# Patient Record
Sex: Male | Born: 1935 | Hispanic: No | Smoking: Never smoker
Health system: Southern US, Community
[De-identification: ages and names within clinical notes are randomized; demographics above are authoritative.]

## PROBLEM LIST (undated history)

## (undated) DIAGNOSIS — E119 Type 2 diabetes mellitus without complications: Secondary | ICD-10-CM

## (undated) DIAGNOSIS — I10 Essential (primary) hypertension: Secondary | ICD-10-CM

## (undated) DIAGNOSIS — I255 Ischemic cardiomyopathy: Secondary | ICD-10-CM

## (undated) DIAGNOSIS — E785 Hyperlipidemia, unspecified: Secondary | ICD-10-CM

## (undated) HISTORY — PX: CARDIAC DEFIBRILLATOR PLACEMENT: SHX171

---

## 2008-10-01 ENCOUNTER — Emergency Department (HOSPITAL_COMMUNITY): Admission: EM | Admit: 2008-10-01 | Discharge: 2008-10-01 | Payer: Self-pay | Admitting: Emergency Medicine

## 2011-01-07 LAB — POCT I-STAT, CHEM 8
BUN: 22 mg/dL (ref 6–23)
Chloride: 103 mEq/L (ref 96–112)
Creatinine, Ser: 1.4 mg/dL (ref 0.4–1.5)
Potassium: 3.6 mEq/L (ref 3.5–5.1)
Sodium: 141 mEq/L (ref 135–145)

## 2011-01-07 LAB — URINALYSIS, ROUTINE W REFLEX MICROSCOPIC
Glucose, UA: NEGATIVE mg/dL
Hgb urine dipstick: NEGATIVE
Specific Gravity, Urine: 1.015 (ref 1.005–1.030)
pH: 7 (ref 5.0–8.0)

## 2011-01-07 LAB — DIFFERENTIAL
Eosinophils Absolute: 0.2 10*3/uL (ref 0.0–0.7)
Lymphocytes Relative: 5 % — ABNORMAL LOW (ref 12–46)
Lymphs Abs: 0.9 10*3/uL (ref 0.7–4.0)
Monocytes Relative: 6 % (ref 3–12)
Neutrophils Relative %: 88 % — ABNORMAL HIGH (ref 43–77)

## 2011-01-07 LAB — CBC
MCV: 89.6 fL (ref 78.0–100.0)
RBC: 4.69 MIL/uL (ref 4.22–5.81)
WBC: 17.1 10*3/uL — ABNORMAL HIGH (ref 4.0–10.5)

## 2011-01-07 LAB — POCT CARDIAC MARKERS
CKMB, poc: 1.2 ng/mL (ref 1.0–8.0)
Myoglobin, poc: 90.2 ng/mL (ref 12–200)

## 2016-08-14 ENCOUNTER — Observation Stay (HOSPITAL_COMMUNITY)
Admission: EM | Admit: 2016-08-14 | Discharge: 2016-08-16 | Disposition: A | Discharge status: Home / Self Care | Payer: Medicare HMO | Attending: Internal Medicine | Admitting: Internal Medicine

## 2016-08-14 ENCOUNTER — Encounter (HOSPITAL_COMMUNITY): Payer: Self-pay

## 2016-08-14 DIAGNOSIS — Z794 Long term (current) use of insulin: Secondary | ICD-10-CM | POA: Diagnosis not present

## 2016-08-14 DIAGNOSIS — E11649 Type 2 diabetes mellitus with hypoglycemia without coma: Secondary | ICD-10-CM | POA: Diagnosis not present

## 2016-08-14 DIAGNOSIS — N189 Chronic kidney disease, unspecified: Secondary | ICD-10-CM

## 2016-08-14 DIAGNOSIS — Z7982 Long term (current) use of aspirin: Secondary | ICD-10-CM | POA: Insufficient documentation

## 2016-08-14 DIAGNOSIS — I10 Essential (primary) hypertension: Secondary | ICD-10-CM | POA: Diagnosis present

## 2016-08-14 DIAGNOSIS — N184 Chronic kidney disease, stage 4 (severe): Secondary | ICD-10-CM | POA: Insufficient documentation

## 2016-08-14 DIAGNOSIS — E119 Type 2 diabetes mellitus without complications: Secondary | ICD-10-CM

## 2016-08-14 DIAGNOSIS — Z95810 Presence of automatic (implantable) cardiac defibrillator: Secondary | ICD-10-CM | POA: Insufficient documentation

## 2016-08-14 DIAGNOSIS — E1151 Type 2 diabetes mellitus with diabetic peripheral angiopathy without gangrene: Secondary | ICD-10-CM | POA: Insufficient documentation

## 2016-08-14 DIAGNOSIS — E785 Hyperlipidemia, unspecified: Secondary | ICD-10-CM | POA: Insufficient documentation

## 2016-08-14 DIAGNOSIS — D631 Anemia in chronic kidney disease: Secondary | ICD-10-CM | POA: Diagnosis not present

## 2016-08-14 DIAGNOSIS — E1122 Type 2 diabetes mellitus with diabetic chronic kidney disease: Secondary | ICD-10-CM | POA: Diagnosis not present

## 2016-08-14 DIAGNOSIS — I129 Hypertensive chronic kidney disease with stage 1 through stage 4 chronic kidney disease, or unspecified chronic kidney disease: Secondary | ICD-10-CM | POA: Diagnosis not present

## 2016-08-14 DIAGNOSIS — G459 Transient cerebral ischemic attack, unspecified: Secondary | ICD-10-CM | POA: Diagnosis present

## 2016-08-14 DIAGNOSIS — M542 Cervicalgia: Secondary | ICD-10-CM | POA: Diagnosis not present

## 2016-08-14 DIAGNOSIS — F329 Major depressive disorder, single episode, unspecified: Secondary | ICD-10-CM | POA: Diagnosis not present

## 2016-08-14 DIAGNOSIS — I255 Ischemic cardiomyopathy: Secondary | ICD-10-CM | POA: Diagnosis not present

## 2016-08-14 DIAGNOSIS — Z9114 Patient's other noncompliance with medication regimen: Secondary | ICD-10-CM | POA: Insufficient documentation

## 2016-08-14 DIAGNOSIS — K219 Gastro-esophageal reflux disease without esophagitis: Secondary | ICD-10-CM | POA: Insufficient documentation

## 2016-08-14 DIAGNOSIS — K5900 Constipation, unspecified: Secondary | ICD-10-CM | POA: Diagnosis not present

## 2016-08-14 DIAGNOSIS — R1084 Generalized abdominal pain: Secondary | ICD-10-CM | POA: Insufficient documentation

## 2016-08-14 DIAGNOSIS — I700 Atherosclerosis of aorta: Secondary | ICD-10-CM | POA: Diagnosis not present

## 2016-08-14 DIAGNOSIS — Z862 Personal history of diseases of the blood and blood-forming organs and certain disorders involving the immune mechanism: Secondary | ICD-10-CM

## 2016-08-14 DIAGNOSIS — I313 Pericardial effusion (noninflammatory): Secondary | ICD-10-CM | POA: Insufficient documentation

## 2016-08-14 HISTORY — DX: Hyperlipidemia, unspecified: E78.5

## 2016-08-14 HISTORY — DX: Essential (primary) hypertension: I10

## 2016-08-14 HISTORY — DX: Type 2 diabetes mellitus without complications: E11.9

## 2016-08-14 HISTORY — DX: Ischemic cardiomyopathy: I25.5

## 2016-08-14 MED ORDER — SODIUM CHLORIDE 0.9 % IV BOLUS (SEPSIS)
1000.0000 mL | Freq: Once | INTRAVENOUS | Status: AC
  Administered 2016-08-15: 1000 mL via INTRAVENOUS

## 2016-08-14 NOTE — ED Triage Notes (Signed)
Patient comes by EMS for fatigue and near syncopal episode.  Patient is from out of town had 2 hr car ride here.  No pain and negative code stroke screen per ONI MD. A&Ox4

## 2016-08-14 NOTE — ED Provider Notes (Signed)
[facility]-EMERGENCY DEPT Provider Note   CSN: [id number] Arrival date & time: 08/14/16  2333  By signing my name below, I, Richard Baxter, attest that this documentation has been prepared under the direction and in the presence of Richard Crumble, MD . Electronically Signed: Freida Baxter, Scribe. 08/14/2016. 11:44 PM.  History   Chief Complaint Chief Complaint  Patient presents with  . Weakness   The history is provided by the patient and the EMS personnel. No language interpreter was used.     HPI Comments:  Richard Baxter is a 82 y.o. male with a history of DM,  who presents to the Emergency Department via EMS, complaining of LLE weakness which began ~ 2100 this evening. Pt states he was walking when the LLE "gave way". He reports associated moderate pain to the LLE. EMS reports 1 episode of nausea en route and state pt's daughter reported to them that the pt had slurred but EMS could not appreciate any slurred speech en route. Pt was given zofran with relief of the nausea. Pt denies h/o MI and CVA. Pt also notes he may have accidentally taken an extra dose of his Renexa today but is unsure. EMS reported blood sugar of 250 just PTA.    Past Medical History:  Diagnosis Date  . Hypertension     There are no active problems to display for this patient.   History reviewed. No pertinent surgical history.     Home Medications    Prior to Admission medications   Medication Sig Start Date End Date Taking? Authorizing Provider  amlodipine-atorvastatin (CADUET) 5-80 MG tablet Take 1 tablet by mouth daily.   Yes Historical Provider, MD  carvedilol (COREG) 6.25 MG tablet Take 6.25 mg by mouth 2 (two) times daily with a meal.   Yes Historical Provider, MD  citalopram (CELEXA) 10 MG tablet Take 10 mg by mouth daily.   Yes Historical Provider, MD  diphenhydramine-acetaminophen (TYLENOL PM) 25-500 MG TABS tablet Take 1-2 tablets by mouth at bedtime as needed (sleep).   Yes Historical Provider, MD    furosemide (LASIX) 40 MG tablet Take 40 mg by mouth daily.   Yes Historical Provider, MD  insulin detemir (LEVEMIR) 100 unit/ml SOLN Inject 65 Units into the skin every morning.   Yes Historical Provider, MD  isosorbide mononitrate (IMDUR) 30 MG 24 hr tablet Take 30 mg by mouth daily.   Yes Historical Provider, MD  omeprazole (PRILOSEC) 20 MG capsule Take 20 mg by mouth 2 (two) times daily.   Yes Historical Provider, MD  pregabalin (LYRICA) 75 MG capsule Take 75 mg by mouth 2 (two) times daily.   Yes Historical Provider, MD  ranolazine (RANEXA) 1000 MG SR tablet Take 1,000 mg by mouth 2 (two) times daily.   Yes Historical Provider, MD  traZODone (DESYREL) 50 MG tablet Take 50 mg by mouth at bedtime as needed for sleep.   Yes Historical Provider, MD    Family History History reviewed. No pertinent family history.  Social History Social History  Substance Use Topics  . Smoking status: Never Smoker  . Smokeless tobacco: Never Used  . Alcohol use No     Allergies   Patient has no known allergies.   Review of Systems Review of Systems  10 systems reviewed and all are negative for acute change except as noted in the HPI.   Physical Exam Updated Vital Signs BP 168/69   Pulse 63   Resp 15   SpO2 95%   Physical  Exam  Constitutional: He is oriented to person, place, and time. Vital signs are normal. He appears well-developed and well-nourished.  Non-toxic appearance. He does not appear ill. No distress.  HENT:  Head: Normocephalic and atraumatic.  Nose: Nose normal.  Mouth/Throat: Oropharynx is clear and moist. No oropharyngeal exudate.  Eyes: Conjunctivae and EOM are normal. Pupils are equal, round, and reactive to light. No scleral icterus.  Neck: Normal range of motion. Neck supple. No tracheal deviation, no edema, no erythema and normal range of motion present. No thyroid mass and no thyromegaly present.  Cardiovascular: Normal rate, regular rhythm, S1 normal, S2 normal,  normal heart sounds, intact distal pulses and normal pulses.  Exam reveals no gallop and no friction rub.   No murmur heard. Pulmonary/Chest: Effort normal and breath sounds normal. No respiratory distress. He has no wheezes. He has no rhonchi. He has no rales.  Pacemaker left chest wall   Abdominal: Soft. Normal appearance and bowel sounds are normal. He exhibits no distension, no ascites and no mass. There is no hepatosplenomegaly. There is no tenderness. There is no rebound, no guarding and no CVA tenderness.  Musculoskeletal: Normal range of motion. He exhibits edema. He exhibits no tenderness.  BLE edema   Lymphadenopathy:    He has no cervical adenopathy.  Neurological: He is alert and oriented to person, place, and time. He has normal strength. No cranial nerve deficit or sensory deficit.  Normal strength and sensation to all extremities Normal cerebellar testing   Skin: Skin is warm, dry and intact. No petechiae and no rash noted. He is not diaphoretic. No erythema. No pallor.  Nursing note and vitals reviewed.    ED Treatments / Results  DIAGNOSTIC STUDIES:  Oxygen Saturation is 100% on room air, normal by my interpretation.    COORDINATION OF CARE:  11:35 PM Discussed treatment plan with pt at bedside and pt agreed to plan.  Labs (all labs ordered are listed, but only abnormal results are displayed) Labs Reviewed  CBC - Abnormal; Notable for the following:       Result Value   RBC 3.98 (*)    Hemoglobin 12.0 (*)    HCT 35.7 (*)    All other components within normal limits  DIFFERENTIAL - Abnormal; Notable for the following:    Neutro Abs 7.8 (*)    All other components within normal limits  COMPREHENSIVE METABOLIC PANEL - Abnormal; Notable for the following:    Sodium 133 (*)    Chloride 100 (*)    Glucose, Bld 317 (*)    BUN 34 (*)    Creatinine, Ser 2.11 (*)    Calcium 8.4 (*)    ALT 14 (*)    GFR calc non Af Amer 28 (*)    GFR calc Af Amer 32 (*)    All  other components within normal limits  I-STAT CHEM 8, ED - Abnormal; Notable for the following:    Sodium 133 (*)    Chloride 98 (*)    BUN 50 (*)    Creatinine, Ser 2.00 (*)    Glucose, Bld 310 (*)    Calcium, Ion 1.07 (*)    Hemoglobin 12.6 (*)    HCT 37.0 (*)    All other components within normal limits  PROTIME-INR  APTT  ETHANOL  RAPID URINE DRUG SCREEN, HOSP PERFORMED  URINALYSIS, ROUTINE W REFLEX MICROSCOPIC (NOT [facility])  I-STAT TROPOININ, ED    EKG  EKG Interpretation  Date/Time:  Wednesday August 14 2016 23:46:56 EST Ventricular Rate:  63 PR Interval:    QRS Duration: 123 QT Interval:  482 QTC Calculation: 494 R Axis:   -45 Text Interpretation:  Sinus rhythm Left bundle branch block tachycardia now resolved Confirmed by Erroll Lunani, Marcia Lepera Ayokunle [phone]([id number]) on 08/14/2016 11:52:19 PM       Radiology Ct Head Wo Contrast  Result Date: 08/15/2016 CLINICAL DATA:  Acute onset of left leg weakness. Initial encounter. EXAM: CT HEAD WITHOUT CONTRAST TECHNIQUE: Contiguous axial images were obtained from the base of the skull through the vertex without intravenous contrast. COMPARISON:  None. FINDINGS: Brain: No evidence of acute infarction, hemorrhage, hydrocephalus, extra-axial collection or mass lesion/mass effect. Prominence of the ventricles and sulci reflects mild to moderate cortical volume loss. Cerebellar atrophy is noted. Mild periventricular white matter change likely reflects small vessel ischemic microangiopathy. The brainstem and fourth ventricle are within normal limits. The basal ganglia are unremarkable in appearance. The cerebral hemispheres demonstrate grossly normal gray-white differentiation. No mass effect or midline shift is seen. Vascular: No hyperdense vessel or unexpected calcification. Skull: There is no evidence of fracture; visualized osseous structures are unremarkable in appearance. Sinuses/Orbits: The orbits are within normal limits. The paranasal  sinuses and mastoid air cells are well-aerated. Other: No significant soft tissue abnormalities are seen. IMPRESSION: 1. No acute intracranial pathology seen on CT. 2. Mild to moderate cortical volume loss and scattered small vessel ischemic microangiopathy. Electronically Signed   By: Roanna RaiderJeffery  Chang M.D.   On: 08/15/2016 00:44    Procedures Procedures (including critical care time)  Medications Ordered in ED Medications  sodium chloride 0.9 % bolus 1,000 mL (1,000 mLs Intravenous New Bag/Given 08/15/16 0039)     Initial Impression / Assessment and Plan / ED Course  I have reviewed the triage vital signs and the nursing notes.  Pertinent labs & imaging results that were available during my care of the patient were reviewed by me and considered in my medical decision making (see chart for details).  Clinical Course     Patient presents to the ED for weakness and slurred speech.  He fell multiple times due to the isolated LLE weakness.  Symptoms have now all resolved and are concerning for TIA.  Will perform ED work up and consult for admission for further evaluation.   1:25 AM labs and CT scan are unremarkable. I spoke with Dr. Amada JupiterKirkpatrick with neurology who recommends the patient be admitted for TIA workup. Will page the hospitalist for further care.  Final Clinical Impressions(s) / ED Diagnoses   Final diagnoses:  None    New Prescriptions New Prescriptions   No medications on file    I personally performed the services described in this documentation, which was scribed in my presence. The recorded information has been reviewed and is accurate.       Richard CrumbleAdeleke Demitrios Molyneux, MD 08/15/16 [phone]

## 2016-08-15 ENCOUNTER — Observation Stay (HOSPITAL_COMMUNITY): Payer: Medicare HMO

## 2016-08-15 ENCOUNTER — Observation Stay (HOSPITAL_BASED_OUTPATIENT_CLINIC_OR_DEPARTMENT_OTHER): Payer: Medicare HMO

## 2016-08-15 ENCOUNTER — Emergency Department (HOSPITAL_COMMUNITY): Payer: Medicare HMO

## 2016-08-15 ENCOUNTER — Encounter (HOSPITAL_COMMUNITY): Payer: Self-pay | Admitting: *Deleted

## 2016-08-15 DIAGNOSIS — I255 Ischemic cardiomyopathy: Secondary | ICD-10-CM | POA: Diagnosis not present

## 2016-08-15 DIAGNOSIS — I10 Essential (primary) hypertension: Secondary | ICD-10-CM

## 2016-08-15 DIAGNOSIS — Z862 Personal history of diseases of the blood and blood-forming organs and certain disorders involving the immune mechanism: Secondary | ICD-10-CM

## 2016-08-15 DIAGNOSIS — K5900 Constipation, unspecified: Secondary | ICD-10-CM | POA: Diagnosis present

## 2016-08-15 DIAGNOSIS — G459 Transient cerebral ischemic attack, unspecified: Secondary | ICD-10-CM | POA: Diagnosis not present

## 2016-08-15 DIAGNOSIS — N184 Chronic kidney disease, stage 4 (severe): Secondary | ICD-10-CM

## 2016-08-15 DIAGNOSIS — N189 Chronic kidney disease, unspecified: Secondary | ICD-10-CM

## 2016-08-15 DIAGNOSIS — M542 Cervicalgia: Secondary | ICD-10-CM | POA: Diagnosis present

## 2016-08-15 LAB — CBC
HEMATOCRIT: 35.7 % — AB (ref 39.0–52.0)
HEMOGLOBIN: 12 g/dL — AB (ref 13.0–17.0)
MCH: 30.2 pg (ref 26.0–34.0)
MCHC: 33.6 g/dL (ref 30.0–36.0)
MCV: 89.7 fL (ref 78.0–100.0)
Platelets: 193 10*3/uL (ref 150–400)
RBC: 3.98 MIL/uL — AB (ref 4.22–5.81)
RDW: 12.9 % (ref 11.5–15.5)
WBC: 9.6 10*3/uL (ref 4.0–10.5)

## 2016-08-15 LAB — URINALYSIS, ROUTINE W REFLEX MICROSCOPIC
BILIRUBIN URINE: NEGATIVE
Glucose, UA: >1000 mg/dL — AB
HGB URINE DIPSTICK: NEGATIVE
Ketones, ur: NEGATIVE mg/dL
Leukocytes, UA: NEGATIVE
NITRITE: NEGATIVE
PROTEIN: NEGATIVE mg/dL
Specific Gravity, Urine: 1.01 (ref 1.005–1.030)
pH: 6.5 (ref 5.0–8.0)

## 2016-08-15 LAB — COMPREHENSIVE METABOLIC PANEL
ALK PHOS: 69 U/L (ref 38–126)
ALT: 14 U/L — AB (ref 17–63)
AST: 19 U/L (ref 15–41)
Albumin: 3.5 g/dL (ref 3.5–5.0)
Anion gap: 7 (ref 5–15)
BUN: 34 mg/dL — AB (ref 6–20)
CALCIUM: 8.4 mg/dL — AB (ref 8.9–10.3)
CHLORIDE: 100 mmol/L — AB (ref 101–111)
CO2: 26 mmol/L (ref 22–32)
CREATININE: 2.11 mg/dL — AB (ref 0.61–1.24)
GFR calc Af Amer: 32 mL/min — ABNORMAL LOW (ref >60)
GFR, EST NON AFRICAN AMERICAN: 28 mL/min — AB (ref >60)
Glucose, Bld: 317 mg/dL — ABNORMAL HIGH (ref 65–99)
Potassium: 4.7 mmol/L (ref 3.5–5.1)
Sodium: 133 mmol/L — ABNORMAL LOW (ref 135–145)
Total Bilirubin: 0.4 mg/dL (ref 0.3–1.2)
Total Protein: 6.5 g/dL (ref 6.5–8.1)

## 2016-08-15 LAB — LIPID PANEL
CHOL/HDL RATIO: 4.4 ratio
Cholesterol: 142 mg/dL (ref 0–200)
HDL: 32 mg/dL — AB (ref >40)
LDL CALC: 89 mg/dL (ref 0–99)
TRIGLYCERIDES: 103 mg/dL (ref <150)
VLDL: 21 mg/dL (ref 0–40)

## 2016-08-15 LAB — RAPID URINE DRUG SCREEN, HOSP PERFORMED
AMPHETAMINES: NOT DETECTED
BARBITURATES: NOT DETECTED
BENZODIAZEPINES: NOT DETECTED
Cocaine: NOT DETECTED
Opiates: NOT DETECTED
Tetrahydrocannabinol: NOT DETECTED

## 2016-08-15 LAB — DIFFERENTIAL
BASOS ABS: 0 10*3/uL (ref 0.0–0.1)
BASOS PCT: 0 %
Eosinophils Absolute: 0.1 10*3/uL (ref 0.0–0.7)
Eosinophils Relative: 1 %
LYMPHS ABS: 1.1 10*3/uL (ref 0.7–4.0)
LYMPHS PCT: 11 %
MONO ABS: 0.6 10*3/uL (ref 0.1–1.0)
MONOS PCT: 7 %
NEUTROS ABS: 7.8 10*3/uL — AB (ref 1.7–7.7)
Neutrophils Relative %: 81 %

## 2016-08-15 LAB — I-STAT CHEM 8, ED
BUN: 50 mg/dL — AB (ref 6–20)
CALCIUM ION: 1.07 mmol/L — AB (ref 1.15–1.40)
CHLORIDE: 98 mmol/L — AB (ref 101–111)
CREATININE: 2 mg/dL — AB (ref 0.61–1.24)
GLUCOSE: 310 mg/dL — AB (ref 65–99)
HCT: 37 % — ABNORMAL LOW (ref 39.0–52.0)
Hemoglobin: 12.6 g/dL — ABNORMAL LOW (ref 13.0–17.0)
POTASSIUM: 5.1 mmol/L (ref 3.5–5.1)
Sodium: 133 mmol/L — ABNORMAL LOW (ref 135–145)
TCO2: 30 mmol/L (ref 0–100)

## 2016-08-15 LAB — I-STAT TROPONIN, ED: TROPONIN I, POC: 0.01 ng/mL (ref 0.00–0.08)

## 2016-08-15 LAB — URINE MICROSCOPIC-ADD ON
RBC / HPF: NONE SEEN RBC/hpf (ref 0–5)
WBC, UA: NONE SEEN WBC/hpf (ref 0–5)

## 2016-08-15 LAB — PROTIME-INR
INR: 1.01
Prothrombin Time: 13.3 seconds (ref 11.4–15.2)

## 2016-08-15 LAB — APTT: APTT: 27 s (ref 24–36)

## 2016-08-15 LAB — ETHANOL: Alcohol, Ethyl (B): <5 mg/dL (ref <5)

## 2016-08-15 MED ORDER — FUROSEMIDE 40 MG PO TABS
40.0000 mg | ORAL_TABLET | Freq: Every day | ORAL | Status: DC
  Administered 2016-08-15 – 2016-08-16 (×2): 40 mg via ORAL
  Filled 2016-08-15 (×2): qty 1

## 2016-08-15 MED ORDER — CITALOPRAM HYDROBROMIDE 10 MG PO TABS
10.0000 mg | ORAL_TABLET | Freq: Every day | ORAL | Status: DC
  Administered 2016-08-15 – 2016-08-16 (×2): 10 mg via ORAL
  Filled 2016-08-15 (×2): qty 1

## 2016-08-15 MED ORDER — ISOSORBIDE MONONITRATE ER 30 MG PO TB24
30.0000 mg | ORAL_TABLET | Freq: Every day | ORAL | Status: DC
  Administered 2016-08-15 – 2016-08-16 (×2): 30 mg via ORAL
  Filled 2016-08-15 (×2): qty 1

## 2016-08-15 MED ORDER — STROKE: EARLY STAGES OF RECOVERY BOOK
Freq: Once | Status: AC
  Administered 2016-08-15: 04:00:00
  Filled 2016-08-15: qty 1

## 2016-08-15 MED ORDER — ACETAMINOPHEN 325 MG PO TABS
650.0000 mg | ORAL_TABLET | Freq: Four times a day (QID) | ORAL | Status: DC | PRN
  Administered 2016-08-15: 650 mg via ORAL
  Filled 2016-08-15: qty 2

## 2016-08-15 MED ORDER — ATORVASTATIN CALCIUM 80 MG PO TABS
80.0000 mg | ORAL_TABLET | Freq: Every day | ORAL | Status: DC
  Administered 2016-08-15 – 2016-08-16 (×2): 80 mg via ORAL
  Filled 2016-08-15 (×2): qty 1

## 2016-08-15 MED ORDER — TRAZODONE HCL 50 MG PO TABS: 50.0000 mg | ORAL_TABLET | Freq: Every evening | ORAL | Status: DC | PRN

## 2016-08-15 MED ORDER — PANTOPRAZOLE SODIUM 40 MG PO TBEC
40.0000 mg | DELAYED_RELEASE_TABLET | Freq: Two times a day (BID) | ORAL | Status: DC
  Administered 2016-08-15 – 2016-08-16 (×4): 40 mg via ORAL
  Filled 2016-08-15 (×3): qty 1

## 2016-08-15 MED ORDER — RANOLAZINE ER 500 MG PO TB12
1000.0000 mg | ORAL_TABLET | Freq: Two times a day (BID) | ORAL | Status: DC
  Administered 2016-08-15 – 2016-08-16 (×3): 1000 mg via ORAL
  Filled 2016-08-15 (×3): qty 2

## 2016-08-15 MED ORDER — ENOXAPARIN SODIUM 30 MG/0.3ML ~~LOC~~ SOLN
30.0000 mg | SUBCUTANEOUS | Status: DC
  Administered 2016-08-15 – 2016-08-16 (×2): 30 mg via SUBCUTANEOUS
  Filled 2016-08-15 (×2): qty 0.3

## 2016-08-15 MED ORDER — INSULIN DETEMIR 100 UNIT/ML ~~LOC~~ SOLN
45.0000 [IU] | Freq: Every day | SUBCUTANEOUS | Status: DC
  Administered 2016-08-15 – 2016-08-16 (×2): 45 [IU] via SUBCUTANEOUS
  Filled 2016-08-15 (×2): qty 0.45

## 2016-08-15 MED ORDER — PREGABALIN 75 MG PO CAPS
75.0000 mg | ORAL_CAPSULE | Freq: Two times a day (BID) | ORAL | Status: DC
  Administered 2016-08-15 – 2016-08-16 (×3): 75 mg via ORAL
  Filled 2016-08-15 (×3): qty 1

## 2016-08-15 MED ORDER — ONDANSETRON HCL 4 MG/2ML IJ SOLN: 4.0000 mg | Freq: Four times a day (QID) | INTRAMUSCULAR | Status: DC | PRN

## 2016-08-15 MED ORDER — SENNOSIDES-DOCUSATE SODIUM 8.6-50 MG PO TABS
1.0000 | ORAL_TABLET | Freq: Every day | ORAL | Status: DC
  Administered 2016-08-15 (×2): 1 via ORAL
  Filled 2016-08-15 (×2): qty 1

## 2016-08-15 MED ORDER — CARVEDILOL 6.25 MG PO TABS
6.2500 mg | ORAL_TABLET | Freq: Two times a day (BID) | ORAL | Status: DC
  Administered 2016-08-15 – 2016-08-16 (×4): 6.25 mg via ORAL
  Filled 2016-08-15 (×4): qty 1

## 2016-08-15 MED ORDER — ASPIRIN 81 MG PO CHEW
162.0000 mg | CHEWABLE_TABLET | Freq: Every day | ORAL | Status: DC
Start: 2016-08-15 — End: 2016-08-16
  Administered 2016-08-15: 162 mg via ORAL
  Filled 2016-08-15: qty 2

## 2016-08-15 MED ORDER — AMLODIPINE-ATORVASTATIN 5-80 MG PO TABS: 1.0000 | ORAL_TABLET | Freq: Every day | ORAL | Status: DC

## 2016-08-15 MED ORDER — AMLODIPINE BESYLATE 5 MG PO TABS
5.0000 mg | ORAL_TABLET | Freq: Every day | ORAL | Status: DC
  Administered 2016-08-15: 5 mg via ORAL
  Filled 2016-08-15: qty 1

## 2016-08-15 NOTE — Progress Notes (Signed)
PT Cancellation Note  Patient Details Name: Richard Baxter MRN: [id number] DOB: 07/18/1932   Cancelled Treatment:    Reason Eval/Treat Not Completed: Patient at procedure or test/unavailable.  Patient going to radiology.  Will return later today for PT evaluation.   Vena AustriaSusan H Detrick Dani 08/15/2016, 9:14 AM Durenda HurtSusan H. Renaldo Fiddleravis, PT, [facility] Acute Rehab Services Pager [phone]

## 2016-08-15 NOTE — Evaluation (Signed)
Physical Therapy Evaluation Patient Details Name: Richard Baxter MRN: [id number] DOB: 12/16/1931 Today's Date: 08/15/2016   History of Present Illness  Patient is an 82 yo male admitted 08/14/16 with LLE weakness, slurred speech, difficulty with ambulation.   PMH:  HTN, HLD, DM, ICM, AICD placement  Clinical Impression  Patient presents with problems listed below.  Will benefit from acute PT to maximize functional independence prior to discharge home.  Patient with imbalance during gait.  Recommend patient use a RW for safety, and f/u HHPT for gait/balance training at d/c.    Follow Up Recommendations Home health PT;Supervision - Intermittent    Equipment Recommendations  Rolling walker with 5" wheels    Recommendations for Other Services       Precautions / Restrictions Precautions Precautions: Fall Restrictions Weight Bearing Restrictions: No      Mobility  Bed Mobility               General bed mobility comments: Patient in chair  Transfers Overall transfer level: Needs assistance Equipment used: None;Rolling walker (2 wheeled) Transfers: Sit to/from Stand Sit to Stand: Min guard         General transfer comment: Min guard with no assistive device, with slight instability.  Supervision only with RW.  Ambulation/Gait Ambulation/Gait assistance: Min assist;Supervision Ambulation Distance (Feet): 160 Feet (80' x 2) Assistive device: None;Rolling walker (2 wheeled) Gait Pattern/deviations: Step-through pattern;Decreased stride length;Shuffle;Staggering left Gait velocity: decreased   General Gait Details: With no assistive device, patient with loss of balance x3 within 80', staggering to left side.  Patient required assist to maintain balance.  After rest, patient ambulated with RW, requiring supervision only.  No loss of balance.  Stairs            Wheelchair Mobility    Modified Rankin (Stroke Patients Only) Modified Rankin (Stroke Patients  Only) Pre-Morbid Rankin Score: No symptoms Modified Rankin: Moderate disability     Balance Overall balance assessment: Needs assistance         Standing balance support: No upper extremity supported;During functional activity Standing balance-Leahy Scale: Fair Standing balance comment: Assist for dynamic activities in standing                             Pertinent Vitals/Pain Pain Assessment: Faces Faces Pain Scale: Hurts little more Pain Location: Neck - chronic but worse in last several days Pain Descriptors / Indicators: Aching;Sore Pain Intervention(s): Monitored during session;Repositioned    Home Living Family/patient expects to be discharged to:: Private residence Living Arrangements: Alone Available Help at Discharge: Family;Available PRN/intermittently Type of Home: Apartment Home Access: Stairs to enter Entrance Stairs-Rails: Doctor, general practiceight;Left Entrance Stairs-Number of Steps: 3 Home Layout: One level Home Equipment: None      Prior Function Level of Independence: Independent         Comments: Drives     Hand Dominance        Extremity/Trunk Assessment   Upper Extremity Assessment: Overall WFL for tasks assessed           Lower Extremity Assessment: Generalized weakness;LLE deficits/detail   LLE Deficits / Details: Decreased strength compared to RLE - grossly 4/5-4+/5     Communication   Communication: No difficulties  Cognition Arousal/Alertness: Awake/alert Behavior During Therapy: WFL for tasks assessed/performed Overall Cognitive Status: Within Functional Limits for tasks assessed  General Comments      Exercises     Assessment/Plan    PT Assessment Patient needs continued PT services  PT Problem List Decreased strength;Decreased balance;Decreased mobility;Decreased knowledge of use of DME;Pain          PT Treatment Interventions DME instruction;Gait training;Stair training;Functional  mobility training;Therapeutic activities;Balance training;Patient/family education    PT Goals (Current goals can be found in the Care Plan section)  Acute Rehab PT Goals Patient Stated Goal: To return home PT Goal Formulation: With patient Time For Goal Achievement: 08/22/16 Potential to Achieve Goals: Good    Frequency Min 4X/week   Barriers to discharge Decreased caregiver support Patient does not have 24 hour assist.  Reports children call him or come check on him during day.    Co-evaluation               End of Session Equipment Utilized During Treatment: Gait belt Activity Tolerance: Patient tolerated treatment well Patient left: in chair;with call bell/phone within reach;with chair alarm set Nurse Communication: Mobility status (RW and HHPT for d/c)    Functional Assessment Tool Used: Clinical judgement Functional Limitation: Mobility: Walking and moving around Mobility: Walking and Moving Around Current Status ([id number](G8978): At least 1 percent but less than 20 percent impaired, limited or restricted Mobility: Walking and Moving Around Goal Status [phone](G8979): 0 percent impaired, limited or restricted    Time: [id number] PT Time Calculation (min) (ACUTE ONLY): 15 min   Charges:   PT Evaluation $PT Eval Moderate Complexity: 1 Procedure     PT G Codes:   PT G-Codes **NOT FOR INPATIENT CLASS** Functional Assessment Tool Used: Clinical judgement Functional Limitation: Mobility: Walking and moving around Mobility: Walking and Moving Around Current Status (N8295(G8978): At least 1 percent but less than 20 percent impaired, limited or restricted Mobility: Walking and Moving Around Goal Status [phone](G8979): 0 percent impaired, limited or restricted    Vena AustriaSusan H Daphene Chisholm 08/15/2016, 12:41 PM Durenda HurtSusan H. Renaldo Fiddleravis, PT, [facility] Acute Rehab Services Pager [phone]

## 2016-08-15 NOTE — H&P (Signed)
History and Physical    Richard Baxter [place]uhland ZOX:[id number] DOB: 08/30/1932 DOA: 08/14/2016  Referring MD/NP/PA: Dr. Mora Bellmanni  PCP: Pcp Not In System  Patient coming from: via EMS  Chief Complaint: My left leg gave way  HPI: Richard Baxter Antosh is a 82 y.o. male with medical history significant of HTN, HLD, DM type II poorly controlled, ischemic cardiomyopathy, status post AICD who presented with complaints of left lower extremity weakness. Patient had been out and about with this daughter when he noted acute onset of left lower extremity weakness as though his left leg gave way and approximately 8 PM last night. They have been walking back to the car when he was noted to almost fallen but his daughter was there to catch him. Thereafter she noted that he continued to have an altered gait and had slurred speech. She did not note any facial droop. Associated symptoms included constipation( over the last 1-2 weeks), nausea, and one episode of vomiting. Patient denies having any palpitations, chest pain, shortness of breath, or diarrhea  ED Course: Upon admission to the emergency department patient was evaluated and seen to have stable vitals. Lab work revealed creatinine 2.11, BUN 34, glucose 317. Review of records from back in 2013 patient has had abnormal creatinine levels of 1.61- 3.1. CT scan of the brain showed no acute abnormalities. TRH called to complete stroke workup.  Review of Systems: As per HPI otherwise 10 point review of systems negative.   Past Medical History:  Diagnosis Date  . DM (diabetes mellitus) (HCC)   . HLD (hyperlipidemia)   . Hypertension     Past Surgical History:  Procedure Laterality Date  . CARDIAC DEFIBRILLATOR PLACEMENT       reports that he has never smoked. He has never used smokeless tobacco. He reports that he does not drink alcohol or use drugs.  No Known Allergies  History reviewed. No pertinent family history.  Prior to Admission medications   Medication Sig Start  Date End Date Taking? Authorizing Provider  amlodipine-atorvastatin (CADUET) 5-80 MG tablet Take 1 tablet by mouth daily.   Yes Historical Provider, MD  carvedilol (COREG) 6.25 MG tablet Take 6.25 mg by mouth 2 (two) times daily with a meal.   Yes Historical Provider, MD  citalopram (CELEXA) 10 MG tablet Take 10 mg by mouth daily.   Yes Historical Provider, MD  diphenhydramine-acetaminophen (TYLENOL PM) 25-500 MG TABS tablet Take 1-2 tablets by mouth at bedtime as needed (sleep).   Yes Historical Provider, MD  furosemide (LASIX) 40 MG tablet Take 40 mg by mouth daily.   Yes Historical Provider, MD  insulin detemir (LEVEMIR) 100 unit/ml SOLN Inject 65 Units into the skin every morning.   Yes Historical Provider, MD  isosorbide mononitrate (IMDUR) 30 MG 24 hr tablet Take 30 mg by mouth daily.   Yes Historical Provider, MD  omeprazole (PRILOSEC) 20 MG capsule Take 20 mg by mouth 2 (two) times daily.   Yes Historical Provider, MD  pregabalin (LYRICA) 75 MG capsule Take 75 mg by mouth 2 (two) times daily.   Yes Historical Provider, MD  ranolazine (RANEXA) 1000 MG SR tablet Take 1,000 mg by mouth 2 (two) times daily.   Yes Historical Provider, MD  traZODone (DESYREL) 50 MG tablet Take 50 mg by mouth at bedtime as needed for sleep.   Yes Historical Provider, MD    Physical Exam:    Constitutional:  Elderly obese in NAD, calm, comfortable Vitals:   08/15/16 0140 08/15/16 0145 08/15/16  0200 08/15/16 0215  BP:  173/79 156/78 178/78  Pulse:  67 71 76  Resp:  18 14 19   Temp: 98.3 F (36.8 C)     SpO2:  94% 95% 91%   Eyes: PERRL, lids and conjunctivae normal ENMT: Mucous membranes are moist. Posterior pharynx clear of any exudate or lesions. Normal dentition.  Neck: normal, supple, no masses, no thyromegaly Respiratory: clear to auscultation bilaterally, no wheezing, no crackles. Normal respiratory effort. No accessory muscle use.  Cardiovascular: Regular rate and rhythm, +SEM.  no rubs / gallops.  No extremity edema. 2+ pedal pulses. No carotid bruits.  Abdomen: no tenderness, no masses palpated. No hepatosplenomegaly. Bowel sounds positive.  Musculoskeletal: no clubbing / cyanosis. No joint deformity upper and lower extremities. Good ROM, no contractures. Normal muscle tone.  Skin: no rashes, lesions, ulcers. No induration Neurologic: CN 2-12 grossly intact. Abnormal sensation of extremities. DTR normal. Strength 5/5 in all 4. No slurred speech or facial droop noted. Psychiatric: Normal judgment and insight. Alert and oriented x 3. Normal mood.     Labs on Admission: I have personally reviewed following labs and imaging studies  CBC:  Recent Labs Lab 08/15/16 0039 08/15/16 0058  WBC 9.6  --   NEUTROABS 7.8*  --   HGB 12.0* 12.6*  HCT 35.7* 37.0*  MCV 89.7  --   PLT 193  --    Basic Metabolic Panel:  Recent Labs Lab 08/15/16 0039 08/15/16 0058  NA 133* 133*  K 4.7 5.1  CL 100* 98*  CO2 26  --   GLUCOSE 317* 310*  BUN 34* 50*  CREATININE 2.11* 2.00*  CALCIUM 8.4*  --    GFR: CrCl cannot be calculated (Unknown ideal weight.). Liver Function Tests:  Recent Labs Lab 08/15/16 0039  AST 19  ALT 14*  ALKPHOS 69  BILITOT 0.4  PROT 6.5  ALBUMIN 3.5   No results for input(s): LIPASE, AMYLASE in the last 168 hours. No results for input(s): AMMONIA in the last 168 hours. Coagulation Profile:  Recent Labs Lab 08/15/16 0039  INR 1.01   Cardiac Enzymes: No results for input(s): CKTOTAL, CKMB, CKMBINDEX, TROPONINI in the last 168 hours. BNP (last 3 results) No results for input(s): PROBNP in the last 8760 hours. HbA1C: No results for input(s): HGBA1C in the last 72 hours. CBG: No results for input(s): GLUCAP in the last 168 hours. Lipid Profile: No results for input(s): CHOL, HDL, LDLCALC, TRIG, CHOLHDL, LDLDIRECT in the last 72 hours. Thyroid Function Tests: No results for input(s): TSH, T4TOTAL, FREET4, T3FREE, THYROIDAB in the last 72 hours. Anemia  Panel: No results for input(s): VITAMINB12, FOLATE, FERRITIN, TIBC, IRON, RETICCTPCT in the last 72 hours. Urine analysis:    Component Value Date/Time   COLORURINE YELLOW 10/01/2008 2026   APPEARANCEUR CLEAR 10/01/2008 2026   LABSPEC 1.015 10/01/2008 2026   PHURINE 7.0 10/01/2008 2026   GLUCOSEU NEGATIVE 10/01/2008 2026   HGBUR NEGATIVE 10/01/2008 2026   BILIRUBINUR NEGATIVE 10/01/2008 2026   KETONESUR NEGATIVE 10/01/2008 2026   PROTEINUR NEGATIVE 10/01/2008 2026   UROBILINOGEN 1.0 10/01/2008 2026   NITRITE NEGATIVE 10/01/2008 2026   LEUKOCYTESUR  10/01/2008 2026    NEGATIVE MICROSCOPIC NOT DONE ON URINES WITH NEGATIVE PROTEIN, BLOOD, LEUKOCYTES, NITRITE, OR GLUCOSE <1000 mg/dL.   Sepsis Labs: No results found for this or any previous visit (from the past 240 hour(s)).   Radiological Exams on Admission: Ct Head Wo Contrast  Result Date: 08/15/2016 CLINICAL DATA:  Acute onset of  left leg weakness. Initial encounter. EXAM: CT HEAD WITHOUT CONTRAST TECHNIQUE: Contiguous axial images were obtained from the base of the skull through the vertex without intravenous contrast. COMPARISON:  None. FINDINGS: Brain: No evidence of acute infarction, hemorrhage, hydrocephalus, extra-axial collection or mass lesion/mass effect. Prominence of the ventricles and sulci reflects mild to moderate cortical volume loss. Cerebellar atrophy is noted. Mild periventricular white matter change likely reflects small vessel ischemic microangiopathy. The brainstem and fourth ventricle are within normal limits. The basal ganglia are unremarkable in appearance. The cerebral hemispheres demonstrate grossly normal gray-white differentiation. No mass effect or midline shift is seen. Vascular: No hyperdense vessel or unexpected calcification. Skull: There is no evidence of fracture; visualized osseous structures are unremarkable in appearance. Sinuses/Orbits: The orbits are within normal limits. The paranasal sinuses and  mastoid air cells are well-aerated. Other: No significant soft tissue abnormalities are seen. IMPRESSION: 1. No acute intracranial pathology seen on CT. 2. Mild to moderate cortical volume loss and scattered small vessel ischemic microangiopathy. Electronically Signed   By: Roanna Raider M.D.   On: 08/15/2016 00:44    EKG: Independently reviewed. Sinus rhythm with LBBB  Assessment/Plan TIA (transient ischemic attack): Patient presents for a 3 hour period of difficulty walking and slurred speech now resolved. CT scan of the brain negative. - Admit to telemetry bed  - TIA orderset initiated - Neuro checks - Check echocardiogram and vascular ultrasound of the carotid arteries - Patient unable to have MRI secondary to pacemaker - Physical therapy to eval and treat in a.m. - Formally consult neurology if needed in a.m.   Neck pain: Patient denies any recent trauma or falls, but complains of posterior neck pain. - Check cervical x-rays  Ischemic Cardiomyopathy /  status post AICD - Continue Coreg, Caduet,  isosorbide mononitrate, ranolazine, Lasix  Constipation - Check acute abdominal series - Senna  Chronic kidney disease stage III-IV: Chronic. Our review of records it appears the patient's baseline creatinine has ranged 1.61- 3.1 when looking at records from 2013 on care everywhere. - Continue to monitor  Essential hypertension - Continue blood pressure medications as seen above  Diabetes mellitus type 2: From patient report it appears that his diabetes is poorly controlled. - Hypoglycemic protocol - Decreased home Levemir dose from 65 units to 45 units subcutaneous  - CBGs every before meals and at bedtime with moderate sliding-scale insulin - Continue Lyrica for neuropathy  Depression - Continue Celexa  Anemia of chronic kidney disease: Hemoglobin 12.  - Continue to monitor  GERD - Pharmacy substitution of Protonix for omeprazole  DVT prophylaxis: lovenox Code Status:  Full Family Communication: Discussed overall plan of care with the patient and his daughter  Disposition Plan: Likely discharge home if workup negative Consults called: None Admission status: Observation telemetry  Clydie Braun MD [facility] Pager [phone]  If 7PM-7AM, please contact night-coverage [email or web address] Password [email or web address]  08/15/2016, 2:42 AM

## 2016-08-15 NOTE — Progress Notes (Signed)
VASCULAR LAB PRELIMINARY  PRELIMINARY  PRELIMINARY  PRELIMINARY  Carotid duplex completed.    Preliminary report:  1-39% ICA plaquing.  Vertebral artery flow is antegrade.   Zollie Clemence, RVT 08/15/2016, 4:31 PM

## 2016-08-15 NOTE — ED Notes (Signed)
Patient here for generalized weakness with sudden onset.  08-14-16 at 9pm was the onset.  Patient has an NIH of 0.  Passed stroke swallow screen.  EDP believes it is a TIA.  No pain or distress noted at this time.

## 2016-08-15 NOTE — Consult Note (Signed)
Admission H&P    Chief Complaint: speech and gait disturbance HPI: Richard Baxter is an 82 y.o. male who was brought in yesterday for possible TIA.  He noticed trouble walking and felt like he was leaning to the left side.  He thought his left leg might be weak.  His daughter was able to help him.  He then noticed difficulty speaking to where he could not get the words out.  By the time he presented to the Ed, all his symptoms resolved.  He feels nearly back to baseline though he still feels a bit unsteady while walking.  History of Stroke: No.  Date last known well:Date: 08/14/2016 Time last known well: Unable to determine  tPA Given: No: symptoms resolved mRankin:  Past Medical History:  Diagnosis Date  . DM (diabetes mellitus) ([facility])   . HLD (hyperlipidemia)   . Hypertension   . Ischemic cardiomyopathy     Past Surgical History:  Procedure Laterality Date  . CARDIAC DEFIBRILLATOR PLACEMENT      History reviewed. No pertinent family history. Social History:  reports that he has never smoked. He has never used smokeless tobacco. He reports that he does not drink alcohol or use drugs.  Allergies: No Known Allergies  Medications Prior to Admission  Medication Sig Dispense Refill  . amlodipine-atorvastatin (CADUET) 5-80 MG tablet Take 1 tablet by mouth daily.    . carvedilol (COREG) 6.25 MG tablet Take 6.25 mg by mouth 2 (two) times daily with a meal.    . citalopram (CELEXA) 10 MG tablet Take 10 mg by mouth daily.    . diphenhydramine-acetaminophen (TYLENOL PM) 25-500 MG TABS tablet Take 1-2 tablets by mouth at bedtime as needed (sleep).    . furosemide (LASIX) 40 MG tablet Take 40 mg by mouth daily.    . insulin detemir (LEVEMIR) 100 unit/ml SOLN Inject 65 Units into the skin every morning.    . isosorbide mononitrate (IMDUR) 30 MG 24 hr tablet Take 30 mg by mouth daily.    Marland Kitchen omeprazole (PRILOSEC) 20 MG capsule Take 20 mg by mouth 2 (two) times daily.    . pregabalin (LYRICA) 75  MG capsule Take 75 mg by mouth 2 (two) times daily.    . ranolazine (RANEXA) 1000 MG SR tablet Take 1,000 mg by mouth 2 (two) times daily.    . traZODone (DESYREL) 50 MG tablet Take 50 mg by mouth at bedtime as needed for sleep.      ROS: Denies any visual disturbance or numbness or dizziness  Physical Examination: Blood pressure 124/64, pulse 71, temperature 98 F (36.7 C), temperature source Oral, resp. rate 18, height _0  (1.727 m), weight 78 kg (172 lb), SpO2 96 %. No distress noted  Neurologic Examination: MS - awake, alert, oriented to all spheres CN - no dysarthria or other speech disturbance, no facial droop Motor - his mild weakness in all extremities but equally Sensory - has altered sensation in feet from neuropathy DTR - diminished but symmetric coor - no dysmetria Station - able to stand without assistance though he felt unsteady Gait - needed minimal assistance to walk though again he felt like he may fall backwards  Results for orders placed or performed during the hospital encounter of 08/14/16 (from the past 48 hour(s))  Protime-INR     Status: None   Collection Time: 08/15/16 12:39 AM  Result Value Ref Range   Prothrombin Time 13.3 11.4 - 15.2 seconds   INR 1.01   APTT  Status: None   Collection Time: 08/15/16 12:39 AM  Result Value Ref Range   aPTT 27 24 - 36 seconds  CBC     Status: Abnormal   Collection Time: 08/15/16 12:39 AM  Result Value Ref Range   WBC 9.6 4.0 - 10.5 K/uL   RBC 3.98 (L) 4.22 - 5.81 MIL/uL   Hemoglobin 12.0 (L) 13.0 - 17.0 g/dL   HCT 35.7 (L) 39.0 - 52.0 %   MCV 89.7 78.0 - 100.0 fL   MCH 30.2 26.0 - 34.0 pg   MCHC 33.6 30.0 - 36.0 g/dL   RDW 12.9 11.5 - 15.5 %   Platelets 193 150 - 400 K/uL  Differential     Status: Abnormal   Collection Time: 08/15/16 12:39 AM  Result Value Ref Range   Neutrophils Relative % 81 %   Neutro Abs 7.8 (H) 1.7 - 7.7 K/uL   Lymphocytes Relative 11 %   Lymphs Abs 1.1 0.7 - 4.0 K/uL    Monocytes Relative 7 %   Monocytes Absolute 0.6 0.1 - 1.0 K/uL   Eosinophils Relative 1 %   Eosinophils Absolute 0.1 0.0 - 0.7 K/uL   Basophils Relative 0 %   Basophils Absolute 0.0 0.0 - 0.1 K/uL  Comprehensive metabolic panel     Status: Abnormal   Collection Time: 08/15/16 12:39 AM  Result Value Ref Range   Sodium 133 (L) 135 - 145 mmol/L   Potassium 4.7 3.5 - 5.1 mmol/L   Chloride 100 (L) 101 - 111 mmol/L   CO2 26 22 - 32 mmol/L   Glucose, Bld 317 (H) 65 - 99 mg/dL   BUN 34 (H) 6 - 20 mg/dL   Creatinine, Ser 2.11 (H) 0.61 - 1.24 mg/dL   Calcium 8.4 (L) 8.9 - 10.3 mg/dL   Total Protein 6.5 6.5 - 8.1 g/dL   Albumin 3.5 3.5 - 5.0 g/dL   AST 19 15 - 41 U/L   ALT 14 (L) 17 - 63 U/L   Alkaline Phosphatase 69 38 - 126 U/L   Total Bilirubin 0.4 0.3 - 1.2 mg/dL   GFR calc non Af Amer 28 (L) >60 mL/min   GFR calc Af Amer 32 (L) >60 mL/min    Comment: (NOTE) The eGFR has been calculated using the CKD EPI equation. This calculation has not been validated in all clinical situations. eGFR's persistently <60 mL/min signify possible Chronic Kidney Disease.    Anion gap 7 5 - 15  I-stat troponin, ED (not at [facility], [facility])     Status: None   Collection Time: 08/15/16 12:57 AM  Result Value Ref Range   Troponin i, poc 0.01 0.00 - 0.08 ng/mL   Comment 3            Comment: Due to the release kinetics of cTnI, a negative result within the first hours of the onset of symptoms does not rule out myocardial infarction with certainty. If myocardial infarction is still suspected, repeat the test at appropriate intervals.   I-Stat Chem 8, ED  (not at [facility], [facility])     Status: Abnormal   Collection Time: 08/15/16 12:58 AM  Result Value Ref Range   Sodium 133 (L) 135 - 145 mmol/L   Potassium 5.1 3.5 - 5.1 mmol/L   Chloride 98 (L) 101 - 111 mmol/L   BUN 50 (H) 6 - 20 mg/dL   Creatinine, Ser 2.00 (H) 0.61 - 1.24 mg/dL   Glucose, Bld 310 (H) 65 - 99 mg/dL  Calcium, Ion 1.07 (L) 1.15 - 1.40 mmol/L    TCO2 30 0 - 100 mmol/L   Hemoglobin 12.6 (L) 13.0 - 17.0 g/dL   HCT 37.0 (L) 39.0 - 52.0 %  Ethanol     Status: None   Collection Time: 08/15/16  1:01 AM  Result Value Ref Range   Alcohol, Ethyl (B) <5 <5 mg/dL    Comment:        LOWEST DETECTABLE LIMIT FOR SERUM ALCOHOL IS 5 mg/dL FOR MEDICAL PURPOSES ONLY   Urine rapid drug screen (hosp performed)not at [facility]     Status: None   Collection Time: 08/15/16  2:40 AM  Result Value Ref Range   Opiates NONE DETECTED NONE DETECTED   Cocaine NONE DETECTED NONE DETECTED   Benzodiazepines NONE DETECTED NONE DETECTED   Amphetamines NONE DETECTED NONE DETECTED   Tetrahydrocannabinol NONE DETECTED NONE DETECTED   Barbiturates NONE DETECTED NONE DETECTED    Comment:        DRUG SCREEN FOR MEDICAL PURPOSES ONLY.  IF CONFIRMATION IS NEEDED FOR ANY PURPOSE, NOTIFY LAB WITHIN 5 DAYS.        LOWEST DETECTABLE LIMITS FOR URINE DRUG SCREEN Drug Class       Cutoff (ng/mL) Amphetamine      1000 Barbiturate      200 Benzodiazepine   449 Tricyclics       201 Opiates          300 Cocaine          300 THC              50   Urinalysis, Routine w reflex microscopic (not at [facility])     Status: Abnormal   Collection Time: 08/15/16  2:40 AM  Result Value Ref Range   Color, Urine YELLOW YELLOW   APPearance CLEAR CLEAR   Specific Gravity, Urine 1.010 1.005 - 1.030   pH 6.5 5.0 - 8.0   Glucose, UA >1000 (A) NEGATIVE mg/dL   Hgb urine dipstick NEGATIVE NEGATIVE   Bilirubin Urine NEGATIVE NEGATIVE   Ketones, ur NEGATIVE NEGATIVE mg/dL   Protein, ur NEGATIVE NEGATIVE mg/dL   Nitrite NEGATIVE NEGATIVE   Leukocytes, UA NEGATIVE NEGATIVE  Urine microscopic-add on     Status: Abnormal   Collection Time: 08/15/16  2:40 AM  Result Value Ref Range   Squamous Epithelial / LPF 0-5 (A) NONE SEEN   WBC, UA NONE SEEN 0 - 5 WBC/hpf   RBC / HPF NONE SEEN 0 - 5 RBC/hpf   Bacteria, UA RARE (A) NONE SEEN   Casts HYALINE CASTS (A) NEGATIVE  Lipid panel      Status: Abnormal   Collection Time: 08/15/16  4:11 AM  Result Value Ref Range   Cholesterol 142 0 - 200 mg/dL   Triglycerides 103 <150 mg/dL   HDL 32 (L) >40 mg/dL   Total CHOL/HDL Ratio 4.4 RATIO   VLDL 21 0 - 40 mg/dL   LDL Cholesterol 89 0 - 99 mg/dL    Comment:        Total Cholesterol/HDL:CHD Risk Coronary Heart Disease Risk Table                     Men   Women  1/2 Average Risk   3.4   3.3  Average Risk       5.0   4.4  2 X Average Risk   9.6   7.1  3 X Average Risk  23.4  11.0        Use the calculated Patient Ratio above and the CHD Risk Table to determine the patient's CHD Risk.        ATP III CLASSIFICATION (LDL):  <100     mg/dL   Optimal  100-129  mg/dL   Near or Above                    Optimal  130-159  mg/dL   Borderline  160-189  mg/dL   High  >190     mg/dL   Very High    Dg Cervical Spine 2 Or 3 Views  Result Date: 08/15/2016 CLINICAL DATA:  Chronic neck pain, worse during the past 2 days. EXAM: CERVICAL SPINE - 2-3 VIEW COMPARISON:  None. FINDINGS: C1 to the superior endplate of T1 is imaged on the provided lateral radiograph, though evaluation of the caudal aspect of the cervical spine as well as the cervical thoracic junction is degraded secondary overlying soft tissues and support apparatus. There is apparent asymmetric widening involving the posterior aspect of the C7-T1 intervertebral disc space. No anterolisthesis or retrolisthesis. An open mouth odontoid radiograph was not provided. Cervical vertebral body heights appear preserved. Prevertebral soft tissues appear normal. Mild multilevel cervical spine DDD, worse at C5-C6 and C6-C7 with disc space height loss, endplate irregularity and sclerosis. Atherosclerotic plaque overlies expected location the bilateral carotid bulbs. Regional soft tissues appear normal. Limited visualization of lung apices demonstrates a mid aspect of a left anterior chest wall AICD/pacemaker. Atherosclerotic plaque within the  aortic arch. IMPRESSION: 1. Apparent asymmetric widening involving the posterior aspect of the C7-T1 intervertebral disc space, suboptimally evaluated due to overlying osseous soft tissue structures and support apparatus. Correlation for recent injury and tenderness at this location is advised. If clinical concern persists, further evaluation with cervical spine CT could be performed as indicated. 2. Mild multilevel cervical spine DDD. 3. Atherosclerotic plaque overlies expected location the bilateral carotid bulbs. Further evaluation with dedicated nonemergent carotid Doppler ultrasound could be performed as clinically indicated. 4. Aortic Atherosclerosis (ICD10-170.0) Electronically Signed   By: Sandi Mariscal M.D.   On: 08/15/2016 11:22   Ct Head Wo Contrast  Result Date: 08/15/2016 CLINICAL DATA:  Acute onset of left leg weakness. Initial encounter. EXAM: CT HEAD WITHOUT CONTRAST TECHNIQUE: Contiguous axial images were obtained from the base of the skull through the vertex without intravenous contrast. COMPARISON:  None. FINDINGS: Brain: No evidence of acute infarction, hemorrhage, hydrocephalus, extra-axial collection or mass lesion/mass effect. Prominence of the ventricles and sulci reflects mild to moderate cortical volume loss. Cerebellar atrophy is noted. Mild periventricular white matter change likely reflects small vessel ischemic microangiopathy. The brainstem and fourth ventricle are within normal limits. The basal ganglia are unremarkable in appearance. The cerebral hemispheres demonstrate grossly normal gray-white differentiation. No mass effect or midline shift is seen. Vascular: No hyperdense vessel or unexpected calcification. Skull: There is no evidence of fracture; visualized osseous structures are unremarkable in appearance. Sinuses/Orbits: The orbits are within normal limits. The paranasal sinuses and mastoid air cells are well-aerated. Other: No significant soft tissue abnormalities are  seen. IMPRESSION: 1. No acute intracranial pathology seen on CT. 2. Mild to moderate cortical volume loss and scattered small vessel ischemic microangiopathy. Electronically Signed   By: Garald Balding M.D.   On: 08/15/2016 00:44   Dg Abd Acute W/chest  Result Date: 08/15/2016 CLINICAL DATA:  Per patient he has been having neck pain X a while, has  gotten more severe X 2 days. Patient has pain in his neck when turning his head to the left. Generealized Abdominal pain, constipation X a week. EXAM: DG ABDOMEN ACUTE W/ 1V CHEST COMPARISON:  None. FINDINGS: Pacemaker overlies normal cardiac silhouette. No pulmonary edema, pneumothorax or infiltrate. No dilated loops of large or small bowel. Moderate volume stool throughout the colon. Gas and stool in the rectum. No pathologic calcifications. No organomegaly. Degenerate changes the spine. IMPRESSION: 1.  No acute cardiopulmonary process. 2. Moderate volume stool.  No obstruction. Electronically Signed   By: Suzy Bouchard M.D.   On: 08/15/2016 11:18   Assessment: 82 y.o. male probable TIA.  He is essentially back to baseline except mild unsteadiness of gait.  He is unable to get MRI. His head CT was unremarkable for any acute changes.  His carotid u/s results are pending.  He is scheduled to have an echo tomorrow.  Stroke Risk Factors - diabetes mellitus, hyperlipidemia and hypertension  Plan: 1. HgbA1c, fasting lipid panel 3. PT consult, OT consult as done already 4. Echocardiogram 5. Carotid dopplers 6. Prophylactic therapy-Antiplatelet med: Aspirin - dose 133m 7. Risk factor modification 7. Telemetry monitoring  Stroke education, treatment, and current plan discussed with patient/significant other: Yes.    SDoren Custard MD Neurology 08/15/2016, 5:37 PM

## 2016-08-15 NOTE — ED Notes (Signed)
Admitting at bedside 

## 2016-08-15 NOTE — Progress Notes (Signed)
BP (!) 174/70 (BP Location: Left Arm)   Pulse 78   Temp 98.2 F (36.8 C) (Oral)   Resp 18   Ht 5\' 8"  (1.727 m)   Wt 78 kg (172 lb)   SpO2 94%   BMI 26.15 kg/m  I agree with plan and workup as described by admitting physician. 2-D echo is pending, he is already on a statin, I will go ahead and add aspirin. Neurology has been involved.

## 2016-08-16 ENCOUNTER — Observation Stay (HOSPITAL_BASED_OUTPATIENT_CLINIC_OR_DEPARTMENT_OTHER): Payer: Medicare HMO

## 2016-08-16 ENCOUNTER — Observation Stay (HOSPITAL_COMMUNITY): Payer: Medicare HMO

## 2016-08-16 ENCOUNTER — Other Ambulatory Visit (HOSPITAL_COMMUNITY): Payer: Medicare HMO

## 2016-08-16 DIAGNOSIS — G459 Transient cerebral ischemic attack, unspecified: Secondary | ICD-10-CM

## 2016-08-16 DIAGNOSIS — Z862 Personal history of diseases of the blood and blood-forming organs and certain disorders involving the immune mechanism: Secondary | ICD-10-CM | POA: Diagnosis not present

## 2016-08-16 DIAGNOSIS — E119 Type 2 diabetes mellitus without complications: Secondary | ICD-10-CM

## 2016-08-16 DIAGNOSIS — I255 Ischemic cardiomyopathy: Secondary | ICD-10-CM | POA: Diagnosis not present

## 2016-08-16 DIAGNOSIS — E785 Hyperlipidemia, unspecified: Secondary | ICD-10-CM

## 2016-08-16 LAB — ECHOCARDIOGRAM COMPLETE
Height: 68 in
WEIGHTICAEL: 2752 [oz_av]

## 2016-08-16 LAB — GLUCOSE, CAPILLARY: GLUCOSE-CAPILLARY: 106 mg/dL — AB (ref 65–99)

## 2016-08-16 LAB — HEMOGLOBIN A1C
Hgb A1c MFr Bld: 8 % — ABNORMAL HIGH (ref 4.8–5.6)
Mean Plasma Glucose: 183 mg/dL

## 2016-08-16 MED ORDER — ASPIRIN EC 325 MG PO TBEC
325.0000 mg | DELAYED_RELEASE_TABLET | Freq: Every day | ORAL | Status: DC
  Administered 2016-08-16: 325 mg via ORAL
  Filled 2016-08-16: qty 1

## 2016-08-16 MED ORDER — ASPIRIN 325 MG PO TBEC: 325.0000 mg | DELAYED_RELEASE_TABLET | Freq: Every day | ORAL | 0 refills | Status: AC

## 2016-08-16 NOTE — Progress Notes (Signed)
Pt discharge education and instructions completed with pt at bedside; pt voices understanding and denies any questions. Pt IV and telemetry removed; pt handed his prescription for aspirin. Pt discharge home with family to transport him home. Pt awaiting on his ride and will be transported off unit via wheelchair with his belongings to the side. Dionne BucyP. Amo Arisa Congleton RN

## 2016-08-16 NOTE — Care Management Obs Status (Signed)
MEDICARE OBSERVATION STATUS NOTIFICATION   Patient Details  Name: Richard Baxter MRN: [id number] Date of Birth: 03/16/1932   Medicare Observation Status Notification Given:  Yes (CTH negative)    Kermit BaloKelli F Alitzel Cookson, RN 08/16/2016, 10:57 AM

## 2016-08-16 NOTE — Progress Notes (Signed)
PROGRESS NOTE    Richard Baxter  MVH:[id number] DOB: 08/21/1932 DOA: 08/14/2016 PCP: Pcp Not In System  Brief Narrative: Richard Baxter is a 82 y.o. male with medical history significant of HTN, HLD, DM type II poorly controlled, ischemic cardiomyopathy, status post AICD who presented with complaints of left lower extremity weakness. Patient had been out and about with this daughter when he noted acute onset of left lower extremity weakness as though his left leg gave way and approximately 8 PM last night. They have been walking back to the car when he was noted to almost fallen but his daughter was there to catch him. Thereafter she noted that he continued to have an altered gait and had slurred speech.  Assessment & Plan: TIA (transient ischemic attack):  -h/o difficulty walking and slurred speech now resolved. CT scan of the brain negative. -unable to hava an  MRI due to AICD -ECHO with EF of 45%, improved from 25-30% in 2013 [facility]( [facility]) -Carotid duplex: unremarkable -now on ASA 325mg  -PT/OT/SLP consulted -NEuro following -DC home today if no furthe rworkup recommended  Ischemic Cardiomyopathy /  status post AICD -EF improved to 45% now - Continue Coreg, Caduet,  isosorbide mononitrate, ranolazine, Lasix  Constipation -continue senokot  Chronic kidney disease stage III-IV: Chronic. Our review of records it appears the patient's baseline creatinine has ranged 1.61- 3.1 when looking at records from 2013 on care everywhere. -stable, monitor  Essential hypertension -stable  Diabetes mellitus type 2: From patient report it appears that his diabetes is poorly controlled. - Hypoglycemic protocol - Decreased home Levemir dose from 65 units to 45 units subcutaneous  - CBGs every before meals and at bedtime with moderate sliding-scale insulin - Continue Lyrica for neuropathy  Depression - Continue Celexa  Anemia of chronic kidney disease: Hemoglobin 12.  - Continue to  monitor  GERD - Pharmacy substitution of Protonix for omeprazole  DVT prophylaxis: lovenox Code Status: Full Family Communication:none at bedside Disposition Plan: home today likely  Consultants:  Neuro  Subjective:   Objective: Vitals:   08/15/16 2127 08/16/16 0117 08/16/16 0517 08/16/16 0928  BP: (!) 157/69 (!) 133/53 (!) 135/92 (!) 172/76  Pulse: 69 64 66 75  Resp: 18 18 18 18   Temp: 98.3 F (36.8 C) 98.3 F (36.8 C) 98 F (36.7 C) 98.6 F (37 C)  TempSrc: Oral Oral Oral Oral  SpO2: 96% 97% 96% 100%  Weight:      Height:        Intake/Output Summary (Last 24 hours) at 08/16/16 1150 Last data filed at 08/16/16 0932  Gross per 24 hour  Intake                0 ml  Output             1075 ml  Net            -1075 ml   Filed Weights   08/15/16 0305 08/15/16 0310  Weight: 87.1 kg (192 lb) 78 kg (172 lb)    Examination:  General exam: Appears calm and comfortable, AAOx3, no distress Respiratory system: Clear to auscultation. Respiratory effort normal. Cardiovascular system: S1 & S2 heard, RRR. No JVD, murmurs, rubs, gallops or clicks. No pedal edema. Gastrointestinal system: Abdomen is nondistended, soft and nontender. No organomegaly or masses felt. Normal bowel sounds heard. Central nervous system: Alert and oriented. No focal neurological deficits. Extremities: Symmetric 5 x 5 power. Skin: No rashes, lesions or ulcers Psychiatry: Judgement and  insight appear normal. Mood & affect appropriate.     Data Reviewed: I have personally reviewed following labs and imaging studies  CBC:  Recent Labs Lab 08/15/16 0039 08/15/16 0058  WBC 9.6  --   NEUTROABS 7.8*  --   HGB 12.0* 12.6*  HCT 35.7* 37.0*  MCV 89.7  --   PLT 193  --    Basic Metabolic Panel:  Recent Labs Lab 08/15/16 0039 08/15/16 0058  NA 133* 133*  K 4.7 5.1  CL 100* 98*  CO2 26  --   GLUCOSE 317* 310*  BUN 34* 50*  CREATININE 2.11* 2.00*  CALCIUM 8.4*  --    GFR: Estimated  Creatinine Clearance: 27.6 mL/min (by C-G formula based on SCr of 2 mg/dL (H)). Liver Function Tests:  Recent Labs Lab 08/15/16 0039  AST 19  ALT 14*  ALKPHOS 69  BILITOT 0.4  PROT 6.5  ALBUMIN 3.5   No results for input(s): LIPASE, AMYLASE in the last 168 hours. No results for input(s): AMMONIA in the last 168 hours. Coagulation Profile:  Recent Labs Lab 08/15/16 0039  INR 1.01   Cardiac Enzymes: No results for input(s): CKTOTAL, CKMB, CKMBINDEX, TROPONINI in the last 168 hours. BNP (last 3 results) No results for input(s): PROBNP in the last 8760 hours. HbA1C: No results for input(s): HGBA1C in the last 72 hours. CBG:  Recent Labs Lab 08/16/16 0702  GLUCAP 106*   Lipid Profile:  Recent Labs  08/15/16 0411  CHOL 142  HDL 32*  LDLCALC 89  TRIG 161103  CHOLHDL 4.4   Thyroid Function Tests: No results for input(s): TSH, T4TOTAL, FREET4, T3FREE, THYROIDAB in the last 72 hours. Anemia Panel: No results for input(s): VITAMINB12, FOLATE, FERRITIN, TIBC, IRON, RETICCTPCT in the last 72 hours. Urine analysis:    Component Value Date/Time   COLORURINE YELLOW 08/15/2016 0240   APPEARANCEUR CLEAR 08/15/2016 0240   LABSPEC 1.010 08/15/2016 0240   PHURINE 6.5 08/15/2016 0240   GLUCOSEU >1000 (A) 08/15/2016 0240   HGBUR NEGATIVE 08/15/2016 0240   BILIRUBINUR NEGATIVE 08/15/2016 0240   KETONESUR NEGATIVE 08/15/2016 0240   PROTEINUR NEGATIVE 08/15/2016 0240   UROBILINOGEN 1.0 10/01/2008 2026   NITRITE NEGATIVE 08/15/2016 0240   LEUKOCYTESUR NEGATIVE 08/15/2016 0240   Sepsis Labs: @LABRCNTIP (procalcitonin:4,lacticidven:4)  )No results found for this or any previous visit (from the past 240 hour(s)).       Radiology Studies: Dg Cervical Spine 2 Or 3 Views  Result Date: 08/15/2016 CLINICAL DATA:  Chronic neck pain, worse during the past 2 days. EXAM: CERVICAL SPINE - 2-3 VIEW COMPARISON:  None. FINDINGS: C1 to the superior endplate of T1 is imaged on the  provided lateral radiograph, though evaluation of the caudal aspect of the cervical spine as well as the cervical thoracic junction is degraded secondary overlying soft tissues and support apparatus. There is apparent asymmetric widening involving the posterior aspect of the C7-T1 intervertebral disc space. No anterolisthesis or retrolisthesis. An open mouth odontoid radiograph was not provided. Cervical vertebral body heights appear preserved. Prevertebral soft tissues appear normal. Mild multilevel cervical spine DDD, worse at C5-C6 and C6-C7 with disc space height loss, endplate irregularity and sclerosis. Atherosclerotic plaque overlies expected location the bilateral carotid bulbs. Regional soft tissues appear normal. Limited visualization of lung apices demonstrates a mid aspect of a left anterior chest wall AICD/pacemaker. Atherosclerotic plaque within the aortic arch. IMPRESSION: 1. Apparent asymmetric widening involving the posterior aspect of the C7-T1 intervertebral disc space, suboptimally evaluated due  to overlying osseous soft tissue structures and support apparatus. Correlation for recent injury and tenderness at this location is advised. If clinical concern persists, further evaluation with cervical spine CT could be performed as indicated. 2. Mild multilevel cervical spine DDD. 3. Atherosclerotic plaque overlies expected location the bilateral carotid bulbs. Further evaluation with dedicated nonemergent carotid Doppler ultrasound could be performed as clinically indicated. 4. Aortic Atherosclerosis (ICD10-170.0) Electronically Signed   By: Simonne Come M.D.   On: 08/15/2016 11:22   Ct Head Wo Contrast  Result Date: 08/16/2016 CLINICAL DATA:  TIA.  Left leg weakness and headaches. EXAM: CT HEAD WITHOUT CONTRAST TECHNIQUE: Contiguous axial images were obtained from the base of the skull through the vertex without intravenous contrast. COMPARISON:  08/15/2016 FINDINGS: Brain: There is no evidence  of acute cortical infarct, intracranial hemorrhage, mass, midline shift, or extra-axial fluid collection. Small chronic infarcts are again seen in the left caudate and left cerebellum. There is mild generalized cerebral atrophy. Periventricular white matter hypodensities are unchanged and nonspecific but compatible with mild chronic small vessel ischemic disease. Vascular: Calcified atherosclerosis at the skullbase. No hyperdense vessel. Skull: No fracture or focal osseous lesion. Sinuses/Orbits: No significant sinus disease. Prior bilateral cataract extraction. Other: None. IMPRESSION: 1. No evidence of acute intracranial abnormality. 2. Mild chronic small vessel ischemic disease. 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 Electronically Signed   By: Sebastian Ache M.D.   On: 08/16/2016 10:43   Ct Head Wo Contrast  Result Date: 08/15/2016 CLINICAL DATA:  Acute onset of left leg weakness. Initial encounter. EXAM: CT HEAD WITHOUT CONTRAST TECHNIQUE: Contiguous axial images were obtained from the base of the skull through the vertex without intravenous contrast. COMPARISON:  None. FINDINGS: Brain: No evidence of acute infarction, hemorrhage, hydrocephalus, extra-axial collection or mass lesion/mass effect. Prominence of the ventricles and sulci reflects mild to moderate cortical volume loss. Cerebellar atrophy is noted. Mild periventricular white matter change likely reflects small vessel ischemic microangiopathy. The brainstem and fourth ventricle are within normal limits. The basal ganglia are unremarkable in appearance. The cerebral hemispheres demonstrate grossly normal gray-white differentiation. No mass effect or midline shift is seen. Vascular: No hyperdense vessel or unexpected calcification. Skull: There is no evidence of fracture; visualized osseous structures are unremarkable in appearance. Sinuses/Orbits: The orbits are within normal limits. The paranasal sinuses and mastoid air cells are  well-aerated. Other: No significant soft tissue abnormalities are seen. IMPRESSION: 1. No acute intracranial pathology seen on CT. 2. Mild to moderate cortical volume loss and scattered small vessel ischemic microangiopathy. Electronically Signed   By: Roanna Raider M.D.   On: 08/15/2016 00:44   Dg Abd Acute W/chest  Result Date: 08/15/2016 CLINICAL DATA:  Per patient he has been having neck pain X a while, has gotten more severe X 2 days. Patient has pain in his neck when turning his head to the left. Generealized Abdominal pain, constipation X a week. EXAM: DG ABDOMEN ACUTE W/ 1V CHEST COMPARISON:  None. FINDINGS: Pacemaker overlies normal cardiac silhouette. No pulmonary edema, pneumothorax or infiltrate. No dilated loops of large or small bowel. Moderate volume stool throughout the colon. Gas and stool in the rectum. No pathologic calcifications. No organomegaly. Degenerate changes the spine. IMPRESSION: 1.  No acute cardiopulmonary process. 2. Moderate volume stool.  No obstruction. Electronically Signed   By: Genevive Bi M.D.   On: 08/15/2016 11:18        Scheduled Meds: . aspirin EC  325 mg Oral Daily  .  atorvastatin  80 mg Oral Daily  . carvedilol  6.25 mg Oral BID WC  . citalopram  10 mg Oral Daily  . enoxaparin (LOVENOX) injection  30 mg Subcutaneous Q24H  . furosemide  40 mg Oral Daily  . insulin detemir  45 Units Subcutaneous Daily  . isosorbide mononitrate  30 mg Oral Daily  . pantoprazole  40 mg Oral BID  . pregabalin  75 mg Oral BID  . ranolazine  1,000 mg Oral BID  . senna-docusate  1 tablet Oral QHS   Continuous Infusions:   LOS: 0 days    Time spent:    Zannie Cove, MD [facility] Pager [phone]  If 7PM-7AM, please contact night-coverage [email or web address] Password [email or web address] 08/16/2016, 11:50 AM

## 2016-08-16 NOTE — Progress Notes (Signed)
Preliminary report by tech - Transcranial Doppler Completed.  Marilynne Halstedita Bunny Lowdermilk, BS, RDMS, RVT

## 2016-08-16 NOTE — Progress Notes (Signed)
  Echocardiogram 2D Echocardiogram has been performed.  Richard Baxter, Richard Baxter 08/16/2016, 10:50 AM

## 2016-08-16 NOTE — Progress Notes (Signed)
Physical Therapy Treatment Patient Details Name: Morrell Riddlelfred Chumley MRN: [id number] DOB: 05/25/1932 Today's Date: 08/16/2016    History of Present Illness Patient is an 82 yo male admitted 08/14/16 with LLE weakness, slurred speech, difficulty with ambulation.   PMH:  HTN, HLD, DM, ICM, AICD placement    PT Comments    Patient progressing well with activity. Tolerated ambulation, stair negotiation and dynamic balance assessment (DGI) without use of assistive device. Overall patient with some modest instability but progressing well. Current POC remains appropriate  Follow Up Recommendations  Home health PT;Supervision - Intermittent     Equipment Recommendations  None recommended by PT    Recommendations for Other Services       Precautions / Restrictions Precautions Precautions: Fall Restrictions Weight Bearing Restrictions: No    Mobility  Bed Mobility Overal bed mobility: Modified Independent                Transfers Overall transfer level: Needs assistance Equipment used: None Transfers: Sit to/from Stand Sit to Stand: Supervision         General transfer comment: no physical assist required, supervision for safety  Ambulation/Gait Ambulation/Gait assistance: Supervision Ambulation Distance (Feet): 320 Feet Assistive device: None Gait Pattern/deviations: Step-through pattern;Decreased stride length;Shuffle;Staggering left Gait velocity: decreased   General Gait Details: With no assistive device, patient with loss of balance x3 within 80', staggering to left side.  Patient required assist to maintain balance.  After rest, patient ambulated with RW, requiring supervision only.  No loss of balance.   Stairs            Wheelchair Mobility    Modified Rankin (Stroke Patients Only) Modified Rankin (Stroke Patients Only) Pre-Morbid Rankin Score: No symptoms Modified Rankin: Moderate disability     Balance           Standing balance support: No  upper extremity supported Standing balance-Leahy Scale: Fair Standing balance comment: able to bend down and pick up objects without difficulty                    Cognition Arousal/Alertness: Awake/alert Behavior During Therapy: WFL for tasks assessed/performed Overall Cognitive Status: Within Functional Limits for tasks assessed                      Exercises      General Comments        Pertinent Vitals/Pain Pain Assessment: No/denies pain    Home Living                      Prior Function            PT Goals (current goals can now be found in the care plan section) Acute Rehab PT Goals Patient Stated Goal: To return home PT Goal Formulation: With patient Time For Goal Achievement: 08/22/16 Potential to Achieve Goals: Good Progress towards PT goals: Progressing toward goals    Frequency    Min 4X/week      PT Plan Current plan remains appropriate    Co-evaluation             End of Session Equipment Utilized During Treatment: Gait belt Activity Tolerance: Patient tolerated treatment well Patient left: in chair;with call bell/phone within reach;with chair alarm set     Time: [id number] PT Time Calculation (min) (ACUTE ONLY): 20 min  Charges:  $Gait Training: 8-22 mins  G Codes:      Fabio AsaDevon J Shayda Kalka 08/16/2016, 4:25 PM Charlotte Crumbevon Santos Sollenberger, PT DPT  [phone]

## 2016-08-16 NOTE — Care Management Note (Signed)
Case Management Note  Patient Details  Name: Richard Baxter MRN: [id number] Date of Birth: 07/31/1932  Subjective/Objective:                    Action/Plan: Pt discharging home with orders for [facility] services. CM met with the patient and his family and provided them a choice for [facility] agencies in [place], [place]. They chose [facility]. CM called [facility] and they accepted the referral. Information faxed to them at number provided: [phone]. Pt also with orders for rolling walker. Larene Beach with [facility] DME notified and will deliver the DME to the room.   Expected Discharge Date:  08/16/16               Expected Discharge Plan:  [place]  In-House Referral:     Discharge planning Services  CM Consult  Post Acute Care Choice:  Durable Medical Equipment, Home Health Choice offered to:  Patient  DME Arranged:  Walker rolling DME Agency:  [place]:  PT [place]:  [facility] (now [facility])  Status of Service:  Completed, signed off  If discussed at [place] of Stay Meetings, dates discussed:    Additional Comments:  Pollie Friar, RN 08/16/2016, 4:42 PM

## 2016-08-16 NOTE — Progress Notes (Signed)
STROKE TEAM PROGRESS NOTE   HISTORY OF PRESENT ILLNESS (per record) Richard Baxter is an 82 y.o. male who was brought in yesterday for possible TIA. He noticed trouble walking and felt like he was leaning to the left side.  He thought his left leg might be weak.  His daughter was able to help him.  He then noticed difficulty speaking to where he could not get the words out.  By the time he presented to the ED, all his symptoms resolved.  He feels nearly back to baseline though he still feels a bit unsteady while walking.  History of Stroke: No.  Date last known well:Date: 08/14/2016 Time last known well: Unable to determine  tPA Given: No: symptoms resolved mRankin:   SUBJECTIVE (INTERVAL HISTORY) His family is at the bedside.  Overall he feels his condition is completely resolved. He came from [place] for visit here and he has pacemaker so can not get MRI. He has his own cardiologist to follow in [place]. However, he admitted that he stopped his ASA one year ago and not sure if he takes statin.    OBJECTIVE Temp:  [98 F (36.7 C)-98.6 F (37 C)] 98.6 F (37 C) (11/24 0928) Pulse Rate:  [64-75] 75 (11/24 0928) Cardiac Rhythm: Normal sinus rhythm (11/24 0802) Resp:  [18] 18 (11/24 0928) BP: (124-172)/(53-92) 172/76 (11/24 0928) SpO2:  [94 %-100 %] 100 % (11/24 0928)  CBC:   Recent Labs Lab 08/15/16 0039 08/15/16 0058  WBC 9.6  --   NEUTROABS 7.8*  --   HGB 12.0* 12.6*  HCT 35.7* 37.0*  MCV 89.7  --   PLT 193  --     Basic Metabolic Panel:   Recent Labs Lab 08/15/16 0039 08/15/16 0058  NA 133* 133*  K 4.7 5.1  CL 100* 98*  CO2 26  --   GLUCOSE 317* 310*  BUN 34* 50*  CREATININE 2.11* 2.00*  CALCIUM 8.4*  --     Lipid Panel:     Component Value Date/Time   CHOL 142 08/15/2016 0411   TRIG 103 08/15/2016 0411   HDL 32 (L) 08/15/2016 0411   CHOLHDL 4.4 08/15/2016 0411   VLDL 21 08/15/2016 0411   LDLCALC 89 08/15/2016 0411   HgbA1c: No results found  for: HGBA1C Urine Drug Screen:     Component Value Date/Time   LABOPIA NONE DETECTED 08/15/2016 0240   COCAINSCRNUR NONE DETECTED 08/15/2016 0240   LABBENZ NONE DETECTED 08/15/2016 0240   AMPHETMU NONE DETECTED 08/15/2016 0240   THCU NONE DETECTED 08/15/2016 0240   LABBARB NONE DETECTED 08/15/2016 0240      IMAGING I have personally reviewed the radiological images below and agree with the radiology interpretations.  Dg Cervical Spine 2 Or 3 Views 08/15/2016 1. Apparent asymmetric widening involving the posterior aspect of the C7-T1 intervertebral disc space, suboptimally evaluated due to overlying osseous soft tissue structures and support apparatus. Correlation for recent injury and tenderness at this location is advised. If clinical concern persists, further evaluation with cervical spine CT could be performed as indicated.  2. Mild multilevel cervical spine DDD.  3. Atherosclerotic plaque overlies expected location the bilateral carotid bulbs. Further evaluation with dedicated nonemergent carotid Doppler ultrasound could be performed as clinically indicated.  4. Aortic Atherosclerosis (ICD10-170.0)   Ct Head Wo Contrast 08/16/2016 1. No evidence of acute intracranial abnormality.  2. Mild chronic small vessel ischemic disease.  3. 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   Ct  Head Wo Contrast 08/15/2016 1. No acute intracranial pathology seen on CT.  2. Mild to moderate cortical volume loss and scattered small vessel ischemic microangiopathy.   Transthoracic echocardiogram 08/16/2016 Study Conclusions - Left ventricle: The cavity size was mildly dilated. Wall   thickness was normal. Systolic function was mildly reduced. The   estimated ejection fraction was in the range of 45% to 50%. Mild   diffuse hypokinesis with no identifiable regional variations.   Doppler parameters are consistent with abnormal left ventricular   relaxation (grade 1 diastolic  dysfunction). - Left atrium: The atrium was mildly dilated. - Pericardium, extracardiac: A small pericardial effusion was   identified posterior to the heart and circumferential to the   heart. There was no evidence of hemodynamic compromise.  TCD pending   PHYSICAL EXAM  Temp:  [98 F (36.7 C)-98.6 F (37 C)] 98.6 F (37 C) (11/24 0928) Pulse Rate:  [64-75] 75 (11/24 0928) Resp:  [18] 18 (11/24 0928) BP: (133-172)/(53-92) 172/76 (11/24 0928) SpO2:  [96 %-100 %] 100 % (11/24 0928)  General - Well nourished, well developed, in no apparent distress.  Ophthalmologic - Sharp disc margins OU.   Cardiovascular - Regular rate and rhythm.  Mental Status -  Level of arousal and orientation to time, place, and person were intact. Language including expression, naming, repetition, comprehension was assessed and found intact. Fund of Knowledge was assessed and was intact.  Cranial Nerves II - XII - II - Visual field intact OU. III, IV, VI - Extraocular movements intact. V - Facial sensation intact bilaterally. VII - Facial movement intact bilaterally. VIII - Hearing & vestibular intact bilaterally. X - Palate elevates symmetrically. XI - Chin turning & shoulder shrug intact bilaterally. XII - Tongue protrusion intact.  Motor Strength - The patient's strength was normal in all extremities and pronator drift was absent.  Bulk was normal and fasciculations were absent.   Motor Tone - Muscle tone was assessed at the neck and appendages and was normal.  Reflexes - The patient's reflexes were 1+ in all extremities and he had no pathological reflexes.  Sensory - Light touch, temperature/pinprick were assessed and were symmetrical.    Coordination - The patient had normal movements in the hands with no ataxia or dysmetria.  Tremor was absent.  Gait and Station - deferred   ASSESSMENT/PLAN Richard Baxter is a 82 y.o. male with history ischemic cardiomyopathy, S/P AICD implant,  hypertension, hyperlipidemia, diabetes mellitus, previous infarcts by CT scan of  presenting with left lower extremity weakness and speech difficulties. He did not receive IV t-PA due to resolution of deficits.  TIA - likely small vessel disease given multiple stroke risk factors and medication noncompliance  Resultant  resolution of deficits  MRI - AICD  MRA - AICD  CT repeat - no acute abnormalities. 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  Carotid Doppler - 1-39% ICA plaquing.  Vertebral artery flow is antegrade.   2D Echo - EF 45 - 50% - no cardiac source of emboli identified, improved from prior  TCD - pending  LDL - 89  HgbA1c pending  VTE prophylaxis - Lovenox Diet heart healthy/carb modified Room service appropriate? Yes; Fluid consistency: Thin  No antithrombotic prior to admission, now on aspirin 325 mg daily. Continue ASA on discharge.  Patient counseled to be compliant with his antithrombotic medications  Ongoing aggressive stroke risk factor management  Therapy recommendations: Home health PT recommended.  Disposition: Pending  Cardiomyopathy s/p AICD  TTE EF 45-50% improved from prior 25-30%  Has follow up locally in [place]  Continue current medications.  Hypertension  Stable  Permissive hypertension (OK if < 220/120) but gradually normalize in 5-7 days  Long-term BP goal normotensive  Hyperlipidemia  Home meds:  Lipitor 80 mg daily resumed in hospital  LDL 89, goal < 70  Continue statin at discharge  Diabetes  HgbA1c pending, goal < 7.0  Uncontrolled  Hyperglycemia  On levemir  DM diet  SSI  Close glucose monitoring and PCP follow up  Other Stroke Risk Factors  Advanced age  Hx stroke by imaging - Chronic left basal ganglia and left cerebellar infarcts.  CAD  Other Active Problems  Chronic kidney disease - BUN 50 ; creatinine 2.0  Hospital day # 0  Neurology will sign off. Please call with  questions. Pt will follow up with local neurologist in 4 weeks. Thanks for the consult.  Marvel Plan, MD PhD Stroke Neurology 08/16/2016 5:09 PM   To contact Stroke Continuity provider, please refer to [email or web address]. After hours, contact General Neurology

## 2016-08-18 LAB — VAS US CAROTID
LCCADSYS: 105 cm/s
LCCAPDIAS: 22 cm/s
LCCAPSYS: 90 cm/s
LEFT ECA DIAS: -19 cm/s
LEFT VERTEBRAL DIAS: -16 cm/s
Left CCA dist dias: 26 cm/s
Left ICA dist dias: -23 cm/s
Left ICA dist sys: -51 cm/s
Left ICA prox dias: -27 cm/s
Left ICA prox sys: -115 cm/s
RCCADSYS: -55 cm/s
RCCAPDIAS: -16 cm/s
RCCAPSYS: -75 cm/s
RIGHT ECA DIAS: -11 cm/s
RIGHT VERTEBRAL DIAS: -13 cm/s

## 2016-08-26 NOTE — Discharge Summary (Signed)
Physician Discharge Summary  Richard Baxter ZOX:[id number] DOB: 04/11/1932 DOA: 08/14/2016  PCP: Pcp Not In System  Admit date: 08/14/2016 Discharge date: 08/26/2016  Time spent: 45 minutes  Recommendations for Outpatient Follow-up:  1. Neurology Dr.Xu in 27month  Discharge Diagnoses:  Principal Problem:   TIA (transient ischemic attack) Active Problems:   Neck pain   Cardiomyopathy, ischemic   Constipation   Essential hypertension   History of anemia due to chronic kidney disease   Hyperlipidemia   Diabetes mellitus (HCC)   Discharge Condition: stable  Diet recommendation: DM heart healthy  Filed Weights   08/15/16 0305 08/15/16 0310  Weight: 87.1 kg (192 lb) 78 kg (172 lb)    History of present illness:  Richard Baxter a 82 y.o.malewith medical history significant of HTN, HLD, DM type II poorly controlled,ischemiccardiomyopathy, status post AICD who presented with complaints of left lower extremity weakness. Patient had been out and about with this daughter when he noted acute onset of left lower extremity weakness as though his left leg gave way and approximately 8 PM last night. They have been walking back to the car when he was noted to almost fallen but his daughter was there to catch him. Thereafter she noted that he continued to have an altered gait and had slurred speech and came to the ER  Hospital Course:  TIA (transient ischemic attack):  -h/o difficulty walking and slurred speech now resolved. CT scan of the brain negative. -unable to have an  MRI due to AICD -ECHO with EF of 45%, improved from 25-30% in 2013 [facility]( [facility]) -Carotid duplex: unremarkable -started on ASA 325mg  -Hba1c pending, LDL 89 at discharge, continued statin -PT/OT/SLP consulted, HH PT recommended Advised FU with neurology post discharge  Ischemic Cardiomyopathy / status post AICD -EF improved to 45% now - Continue Coreg, Caduet, isosorbide mononitrate, ranolazine,  Lasix -stable  Constipation -continue senokot  Chronic kidney disease stage III-IV: Chronic. Our review of records it appears the patient's baseline creatinine has ranged 1.61- 3.1when looking at records from 2013 on care everywhere. -stable, monitor  Essential hypertension -stable  Diabetes mellitus type 2: From patient report it appears that his diabetes is poorly controlled. - resumed levemir, Hba1c pending at discharge  Depression - Continue Celexa  Anemiaof chronic kidney disease -stable,  Hemoglobin 12.     Consultations:  Neurology Dr.Xu  Discharge Exam: Vitals:   08/16/16 0928 08/16/16 1700  BP: (!) 172/76 (!) 149/73  Pulse: 75 83  Resp: 18 20  Temp: 98.6 F (37 C) 98.5 F (36.9 C)    General: AAOx3 Cardiovascular: S1S2/RRR Respiratory: CTAB  Discharge Instructions   Discharge Instructions    Diet - low sodium heart healthy    Complete by:  As directed    Diet Carb Modified    Complete by:  As directed    Increase activity slowly    Complete by:  As directed      Discharge Medication List as of 08/16/2016  6:19 PM    START taking these medications   Details  aspirin EC 325 MG EC tablet Take 1 tablet (325 mg total) by mouth daily., Starting Sat 08/17/2016, OTC      CONTINUE these medications which have NOT CHANGED   Details  amlodipine-atorvastatin (CADUET) 5-80 MG tablet Take 1 tablet by mouth daily., Historical Med    carvedilol (COREG) 6.25 MG tablet Take 6.25 mg by mouth 2 (two) times daily with a meal., Historical Med    citalopram (CELEXA)  10 MG tablet Take 10 mg by mouth daily., Historical Med    diphenhydramine-acetaminophen (TYLENOL PM) 25-500 MG TABS tablet Take 1-2 tablets by mouth at bedtime as needed (sleep)., Historical Med    furosemide (LASIX) 40 MG tablet Take 40 mg by mouth daily., Historical Med    insulin detemir (LEVEMIR) 100 unit/ml SOLN Inject 65 Units into the skin every morning., Historical Med     isosorbide mononitrate (IMDUR) 30 MG 24 hr tablet Take 30 mg by mouth daily., Historical Med    omeprazole (PRILOSEC) 20 MG capsule Take 20 mg by mouth 2 (two) times daily., Historical Med    pregabalin (LYRICA) 75 MG capsule Take 75 mg by mouth 2 (two) times daily., Historical Med    ranolazine (RANEXA) 1000 MG SR tablet Take 1,000 mg by mouth 2 (two) times daily., Historical Med    traZODone (DESYREL) 50 MG tablet Take 50 mg by mouth at bedtime as needed for sleep., Historical Med       No Known Allergies Follow-up Information    PCP Follow up in 1 week(s).        Neurologist in [place]. Schedule an appointment as soon as possible for a visit in 1 month(s).   Why:  for TIA follow up           The results of significant diagnostics from this hospitalization (including imaging, microbiology, ancillary and laboratory) are listed below for reference.    Significant Diagnostic Studies: Dg Cervical Spine 2 Or 3 Views  Result Date: 08/15/2016 CLINICAL DATA:  Chronic neck pain, worse during the past 2 days. EXAM: CERVICAL SPINE - 2-3 VIEW COMPARISON:  None. FINDINGS: C1 to the superior endplate of T1 is imaged on the provided lateral radiograph, though evaluation of the caudal aspect of the cervical spine as well as the cervical thoracic junction is degraded secondary overlying soft tissues and support apparatus. There is apparent asymmetric widening involving the posterior aspect of the C7-T1 intervertebral disc space. No anterolisthesis or retrolisthesis. An open mouth odontoid radiograph was not provided. Cervical vertebral body heights appear preserved. Prevertebral soft tissues appear normal. Mild multilevel cervical spine DDD, worse at C5-C6 and C6-C7 with disc space height loss, endplate irregularity and sclerosis. Atherosclerotic plaque overlies expected location the bilateral carotid bulbs. Regional soft tissues appear normal. Limited visualization of lung apices demonstrates a  mid aspect of a left anterior chest wall AICD/pacemaker. Atherosclerotic plaque within the aortic arch. IMPRESSION: 1. Apparent asymmetric widening involving the posterior aspect of the C7-T1 intervertebral disc space, suboptimally evaluated due to overlying osseous soft tissue structures and support apparatus. Correlation for recent injury and tenderness at this location is advised. If clinical concern persists, further evaluation with cervical spine CT could be performed as indicated. 2. Mild multilevel cervical spine DDD. 3. Atherosclerotic plaque overlies expected location the bilateral carotid bulbs. Further evaluation with dedicated nonemergent carotid Doppler ultrasound could be performed as clinically indicated. 4. Aortic Atherosclerosis (ICD10-170.0) Electronically Signed   By: Simonne ComeJohn  Watts M.D.   On: 08/15/2016 11:22   Ct Head Wo Contrast  Result Date: 08/16/2016 CLINICAL DATA:  TIA.  Left leg weakness and headaches. EXAM: CT HEAD WITHOUT CONTRAST TECHNIQUE: Contiguous axial images were obtained from the base of the skull through the vertex without intravenous contrast. COMPARISON:  08/15/2016 FINDINGS: Brain: There is no evidence of acute cortical infarct, intracranial hemorrhage, mass, midline shift, or extra-axial fluid collection. Small chronic infarcts are again seen in the left caudate and left cerebellum. There  is mild generalized cerebral atrophy. Periventricular white matter hypodensities are unchanged and nonspecific but compatible with mild chronic small vessel ischemic disease. Vascular: Calcified atherosclerosis at the skullbase. No hyperdense vessel. Skull: No fracture or focal osseous lesion. Sinuses/Orbits: No significant sinus disease. Prior bilateral cataract extraction. Other: None. IMPRESSION: 1. No evidence of acute intracranial abnormality. 2. Mild chronic small vessel ischemic disease. 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 Electronically Signed   By: Sebastian Ache M.D.   On: 08/16/2016 10:43   Ct Head Wo Contrast  Result Date: 08/15/2016 CLINICAL DATA:  Acute onset of left leg weakness. Initial encounter. EXAM: CT HEAD WITHOUT CONTRAST TECHNIQUE: Contiguous axial images were obtained from the base of the skull through the vertex without intravenous contrast. COMPARISON:  None. FINDINGS: Brain: No evidence of acute infarction, hemorrhage, hydrocephalus, extra-axial collection or mass lesion/mass effect. Prominence of the ventricles and sulci reflects mild to moderate cortical volume loss. Cerebellar atrophy is noted. Mild periventricular white matter change likely reflects small vessel ischemic microangiopathy. The brainstem and fourth ventricle are within normal limits. The basal ganglia are unremarkable in appearance. The cerebral hemispheres demonstrate grossly normal gray-white differentiation. No mass effect or midline shift is seen. Vascular: No hyperdense vessel or unexpected calcification. Skull: There is no evidence of fracture; visualized osseous structures are unremarkable in appearance. Sinuses/Orbits: The orbits are within normal limits. The paranasal sinuses and mastoid air cells are well-aerated. Other: No significant soft tissue abnormalities are seen. IMPRESSION: 1. No acute intracranial pathology seen on CT. 2. Mild to moderate cortical volume loss and scattered small vessel ischemic microangiopathy. Electronically Signed   By: Roanna Raider M.D.   On: 08/15/2016 00:44   Dg Abd Acute W/chest  Result Date: 08/15/2016 CLINICAL DATA:  Per patient he has been having neck pain X a while, has gotten more severe X 2 days. Patient has pain in his neck when turning his head to the left. Generealized Abdominal pain, constipation X a week. EXAM: DG ABDOMEN ACUTE W/ 1V CHEST COMPARISON:  None. FINDINGS: Pacemaker overlies normal cardiac silhouette. No pulmonary edema, pneumothorax or infiltrate. No dilated loops of large or small bowel. Moderate volume  stool throughout the colon. Gas and stool in the rectum. No pathologic calcifications. No organomegaly. Degenerate changes the spine. IMPRESSION: 1.  No acute cardiopulmonary process. 2. Moderate volume stool.  No obstruction. Electronically Signed   By: Genevive Bi M.D.   On: 08/15/2016 11:18    Microbiology: No results found for this or any previous visit (from the past 240 hour(s)).   Labs: Basic Metabolic Panel: No results for input(s): NA, K, CL, CO2, GLUCOSE, BUN, CREATININE, CALCIUM, MG, PHOS in the last 168 hours. Liver Function Tests: No results for input(s): AST, ALT, ALKPHOS, BILITOT, PROT, ALBUMIN in the last 168 hours. No results for input(s): LIPASE, AMYLASE in the last 168 hours. No results for input(s): AMMONIA in the last 168 hours. CBC: No results for input(s): WBC, NEUTROABS, HGB, HCT, MCV, PLT in the last 168 hours. Cardiac Enzymes: No results for input(s): CKTOTAL, CKMB, CKMBINDEX, TROPONINI in the last 168 hours. BNP: BNP (last 3 results) No results for input(s): BNP in the last 8760 hours.  ProBNP (last 3 results) No results for input(s): PROBNP in the last 8760 hours.  CBG: No results for input(s): GLUCAP in the last 168 hours.     SignedZannie Cove MD.  [facility] 08/26/2016, 3:29 PM

## 2016-09-02 ENCOUNTER — Encounter (HOSPITAL_COMMUNITY): Payer: Self-pay | Admitting: Emergency Medicine

## 2016-09-02 ENCOUNTER — Emergency Department (HOSPITAL_COMMUNITY): Payer: Medicare HMO

## 2016-09-02 ENCOUNTER — Emergency Department (HOSPITAL_COMMUNITY)
Admission: EM | Admit: 2016-09-02 | Discharge: 2016-09-02 | Disposition: A | Discharge status: Home / Self Care | Payer: Medicare HMO | Attending: Emergency Medicine | Admitting: Emergency Medicine

## 2016-09-02 DIAGNOSIS — Y999 Unspecified external cause status: Secondary | ICD-10-CM | POA: Insufficient documentation

## 2016-09-02 DIAGNOSIS — Z95810 Presence of automatic (implantable) cardiac defibrillator: Secondary | ICD-10-CM | POA: Diagnosis not present

## 2016-09-02 DIAGNOSIS — I10 Essential (primary) hypertension: Secondary | ICD-10-CM | POA: Insufficient documentation

## 2016-09-02 DIAGNOSIS — Y939 Activity, unspecified: Secondary | ICD-10-CM | POA: Insufficient documentation

## 2016-09-02 DIAGNOSIS — T148XXA Other injury of unspecified body region, initial encounter: Secondary | ICD-10-CM

## 2016-09-02 DIAGNOSIS — Z7982 Long term (current) use of aspirin: Secondary | ICD-10-CM | POA: Diagnosis not present

## 2016-09-02 DIAGNOSIS — Z794 Long term (current) use of insulin: Secondary | ICD-10-CM | POA: Insufficient documentation

## 2016-09-02 DIAGNOSIS — Z8673 Personal history of transient ischemic attack (TIA), and cerebral infarction without residual deficits: Secondary | ICD-10-CM | POA: Insufficient documentation

## 2016-09-02 DIAGNOSIS — Y929 Unspecified place or not applicable: Secondary | ICD-10-CM | POA: Insufficient documentation

## 2016-09-02 DIAGNOSIS — Z79899 Other long term (current) drug therapy: Secondary | ICD-10-CM | POA: Insufficient documentation

## 2016-09-02 DIAGNOSIS — E119 Type 2 diabetes mellitus without complications: Secondary | ICD-10-CM | POA: Insufficient documentation

## 2016-09-02 DIAGNOSIS — S0081XA Abrasion of other part of head, initial encounter: Secondary | ICD-10-CM | POA: Insufficient documentation

## 2016-09-02 DIAGNOSIS — S0990XA Unspecified injury of head, initial encounter: Secondary | ICD-10-CM | POA: Diagnosis present

## 2016-09-02 DIAGNOSIS — W19XXXA Unspecified fall, initial encounter: Secondary | ICD-10-CM

## 2016-09-02 DIAGNOSIS — W0110XA Fall on same level from slipping, tripping and stumbling with subsequent striking against unspecified object, initial encounter: Secondary | ICD-10-CM | POA: Diagnosis not present

## 2016-09-02 NOTE — ED Notes (Signed)
EDP at bedside  

## 2016-09-02 NOTE — ED Triage Notes (Signed)
[facility] after slipping and falling on ice, hitting L forehead, sustaining 1 in abrasion over L eye. Pt takes Eliquis, denies LOC or head/back or neck pain. C/o some L elbow soreness. Hx of HTN, DM, TIA 2 weeks ago, has residual L side wkns and slurred speech. A&ox4, CBG 144 and BP 170/84

## 2016-09-02 NOTE — ED Provider Notes (Signed)
[facility]EMER[facility]ENCY DEPT Provider Note   CSN: [id number] Arrival date & time: 09/02/16  1306     History   Chief Complaint Chief Complaint  Patient presents with  . Fall    HPI Richard Baxter is a 82 y.o. male with history of TIA 2 weeks ago, diabetes, and ischemic cardiomyopathy presents after his legs "gave out" and caused him to slip and fall landing on his left forehead today and also his left elbow. He states that his left elbow is slightly sore but has no abrasion, laceration, pain, or deformity. He denies LOC or any head, back, neck. He states having this weakness in both legs since 2 weeks ago when he was here for a TIA. Patient admits to slurred speech but states his wife does not notice any change in his speech. Patient denies nausea, vomiting, fevers, chills, chest pain, short of breath, or changes in urinary symptoms. Patient on eliquis. Patient denies active bleeding.   HPI  Past Medical History:  Diagnosis Date  . DM (diabetes mellitus) (HCC)   . HLD (hyperlipidemia)   . Hypertension   . Ischemic cardiomyopathy     Patient Active Problem List   Diagnosis Date Noted  . Hyperlipidemia   . Diabetes mellitus (HCC)   . TIA (transient ischemic attack) 08/15/2016  . Neck pain 08/15/2016  . Cardiomyopathy, ischemic 08/15/2016  . Constipation 08/15/2016  . Essential hypertension 08/15/2016  . History of anemia due to chronic kidney disease 08/15/2016    Past Surgical History:  Procedure Laterality Date  . CARDIAC DEFIBRILLATOR PLACEMENT         Home Medications    Prior to Admission medications   Medication Sig Start Date End Date Taking? Authorizing Provider  amlodipine-atorvastatin (CADUET) 5-80 MG tablet Take 1 tablet by mouth daily.    Historical Provider, MD  aspirin EC 325 MG EC tablet Take 1 tablet (325 mg total) by mouth daily. 08/17/16   Zannie Cove, MD  carvedilol (COREG) 6.25 MG tablet Take 6.25 mg by mouth 2 (two) times daily with a meal.     Historical Provider, MD  citalopram (CELEXA) 10 MG tablet Take 10 mg by mouth daily.    Historical Provider, MD  diphenhydramine-acetaminophen (TYLENOL PM) 25-500 MG TABS tablet Take 1-2 tablets by mouth at bedtime as needed (sleep).    Historical Provider, MD  furosemide (LASIX) 40 MG tablet Take 40 mg by mouth daily.    Historical Provider, MD  insulin detemir (LEVEMIR) 100 unit/ml SOLN Inject 65 Units into the skin every morning.    Historical Provider, MD  isosorbide mononitrate (IMDUR) 30 MG 24 hr tablet Take 30 mg by mouth daily.    Historical Provider, MD  omeprazole (PRILOSEC) 20 MG capsule Take 20 mg by mouth 2 (two) times daily.    Historical Provider, MD  pregabalin (LYRICA) 75 MG capsule Take 75 mg by mouth 2 (two) times daily.    Historical Provider, MD  ranolazine (RANEXA) 1000 MG SR tablet Take 1,000 mg by mouth 2 (two) times daily.    Historical Provider, MD  traZODone (DESYREL) 50 MG tablet Take 50 mg by mouth at bedtime as needed for sleep.    Historical Provider, MD    Family History No family history on file.  Social History Social History  Substance Use Topics  . Smoking status: Never Smoker  . Smokeless tobacco: Never Used  . Alcohol use No     Allergies   Patient has no known allergies.  Review of Systems Review of Systems  Constitutional: Negative for chills and fever.  HENT:       Cut on left forehead  Eyes: Negative for photophobia, pain, redness and visual disturbance.  Respiratory: Negative for cough and shortness of breath.   Cardiovascular: Negative for chest pain and palpitations.  Gastrointestinal: Negative for abdominal pain, constipation, diarrhea, nausea and vomiting.  Genitourinary: Negative for dysuria and hematuria.  Musculoskeletal: Positive for gait problem (Since his last visit to ED). Negative for arthralgias, back pain, neck pain and neck stiffness.       Left elbow soreness.   Skin: Negative for color change and rash.    Neurological: Positive for speech difficulty. Negative for syncope.     Physical Exam Updated Vital Signs BP 160/84   Pulse 65   Resp 18   Ht 5\' 8"  (1.727 m)   Wt 78 kg   SpO2 94%   BMI 26.15 kg/m   Physical Exam  Constitutional: He is oriented to person, place, and time. He appears well-developed and well-nourished.  HENT:  Head: Normocephalic. Head is with abrasion.  Nose: Nose normal.  Mouth/Throat: Oropharynx is clear and moist.  Abrasion to left forehead about 1 cm. No noticeable laceration. No active bleeding. Not TTP.  Eyes: Conjunctivae and EOM are normal. Pupils are equal, round, and reactive to light.  Neck: Normal range of motion. Neck supple.  Cardiovascular: Normal rate, normal heart sounds and intact distal pulses.   Pulmonary/Chest: Effort normal and breath sounds normal. No respiratory distress. He exhibits no tenderness.  Abdominal: Soft. Bowel sounds are normal. There is no tenderness. There is no rebound and no guarding.  Musculoskeletal: Normal range of motion.  Neurological: He is alert and oriented to person, place, and time.  Cranial Nerves:  III,IV, VI: ptosis not present, extra-ocular movements intact bilaterally, direct and consensual pupillary light reflexes intact bilaterally V: facial sensation, jaw opening, and bite strength equal bilaterally VII: eyebrow raise, eyelid close, smile, frown, pucker equal bilaterally VIII: hearing grossly normal bilaterally  IX,X: palate elevation and swallowing intact XI: bilateral shoulder shrug and lateral head rotation equal and strong XII: midline tongue extension  Strength equal bilaterally on extremities. Sensation equal bilaterally.  Negative pronator drift. Negative RAM. Negative finger-nose. Patient able to stand with some difficulty.   No focal neurological deficits appreciated.  Skin: Skin is warm. Capillary refill takes less than 2 seconds.  Psychiatric: He has a normal mood and affect. His  behavior is normal.  Nursing note and vitals reviewed.    ED Treatments / Results  Labs (all labs ordered are listed, but only abnormal results are displayed) Labs Reviewed - No data to display  EKG  EKG Interpretation None       Radiology Ct Head Wo Contrast  Result Date: 09/02/2016 CLINICAL DATA:  Recent fall with left frontal trauma EXAM: CT HEAD WITHOUT CONTRAST TECHNIQUE: Contiguous axial images were obtained from the base of the skull through the vertex without intravenous contrast. COMPARISON:  08/16/2016 FINDINGS: Brain: No evidence of acute infarction, hemorrhage, hydrocephalus, extra-axial collection or mass lesion/mass effect. Mild atrophic changes are seen. 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 Ischemic changes in the caudate nucleus on the left are also stable. Vascular: No hyperdense vessel or unexpected calcification. Skull: Normal. Negative for fracture or focal lesion. Sinuses/Orbits: No acute finding. Other: None. IMPRESSION: Chronic atrophy and ischemic change without acute abnormality. Electronically Signed   By: Alcide CleverMark  Lukens M.D.   On:  09/02/2016 15:51    Procedures Procedures (including critical care time)  Medications Ordered in ED Medications - No data to display   Initial Impression / Assessment and Plan / ED Course  I have reviewed the triage vital signs and the nursing notes.  Pertinent labs & imaging results that were available during my care of the patient were reviewed by me and considered in my medical decision making (see chart for details).  Clinical Course   Patient is an 82 year old male presenting to the ED after fall. Patient denied LOC, head, back, or neck pain. Patient states that he's had "wobbly legs" since his TIA about 2 weeks ago. He states he has been doing therapy. He states that his left elbow is slightly sore but has no abrasion, laceration, pain, or deformity. On exam patient is afebrile, hemodynamically  stable, and NAD. Heart and lung sounds are clear. Negative neuro exam. CN 3-12 intact. Strength and sensation intact and equal on upper and lower extremities bilaterally. Negative pronator drift, negative RAM, negative finger to nose. Did not appreciate any slurred speech. He also said that his wife did not notice a change in his speech. Patient able to stand with some difficulty, consistent with his complaint of "wobbly legs" since his TIA. No focal neurological deficits appreciated. Elbow not erythematous, not hot to touch, not TTP, no abrasions, no lacerations, or deformities noted. No tenderness to left shoulder or left wrist. Do not believe left elbow x-ray necessary at this time. CT scan negative for skull fracture or acute intracranial abnormality. Cleaned area off with normal saline. No actual break in skin. Gauze dressing placed. Patient is afebrile hemodynamically stable and in no apparent distress. Patient has no pain or change in symptoms. Patient agreed with assessment and plan. Patient understood the instructions. Patient told to follow up with his primary care physician in 2-5 days. Return precautions given for any new or worsening symptoms such as fever, chills, visual changes, vomiting, severe headache, and focal neurological weakness.   Final Clinical Impressions(s) / ED Diagnoses   Final diagnoses:  Abrasion  Fall, initial encounter    New Prescriptions Discharge Medication List as of 09/02/2016  4:48 PM       [address]Arienna Benegas Manuel ClutierEsp[place], [place] 09/02/16 2152    Raeford RazorStephen Kohut, MD 09/04/16 [phone]

## 2016-09-02 NOTE — ED Notes (Signed)
Care [place], [place]

## 2016-09-02 NOTE — ED Notes (Signed)
Patient transported to CT 

## 2016-09-02 NOTE — Discharge Instructions (Signed)
Please follow-up with your primary care physician. Please continue physical therapy.

## 2017-06-13 IMAGING — CT CT HEAD W/O CM
3 of 4 series · 17 of 47 positions shown, 20 images · non-contrast
Comparison: None.

CLINICAL DATA: Acute onset of left leg weakness. Initial encounter.

EXAM:
CT HEAD WITHOUT CONTRAST
TECHNIQUE: Contiguous axial images were obtained from the base of the skull
through the vertex without intravenous contrast.

[Series 201: head w/o, idose (1) · axial · non-contrast · 0.44mm/px · z∈[+64,+189]mm · 11 of 31 slices shown, 14 images]
[im 3/31  brain]
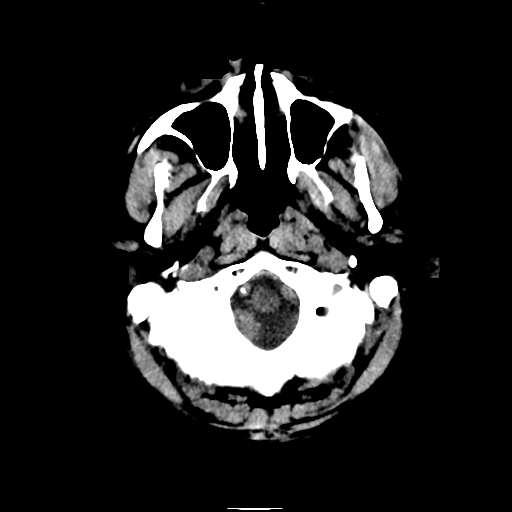
[im 3/31  bone]
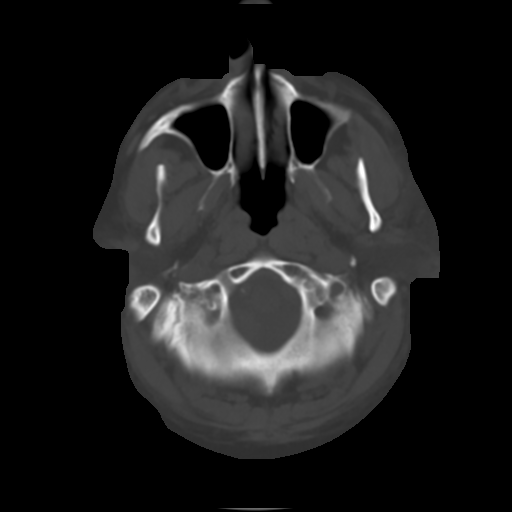
[im 5/31  brain]
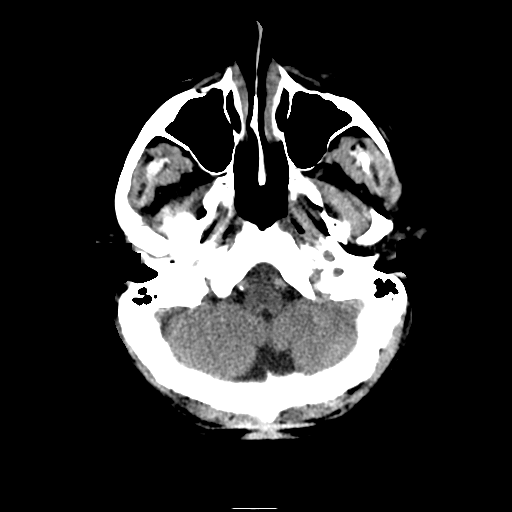
[im 7/31  brain]
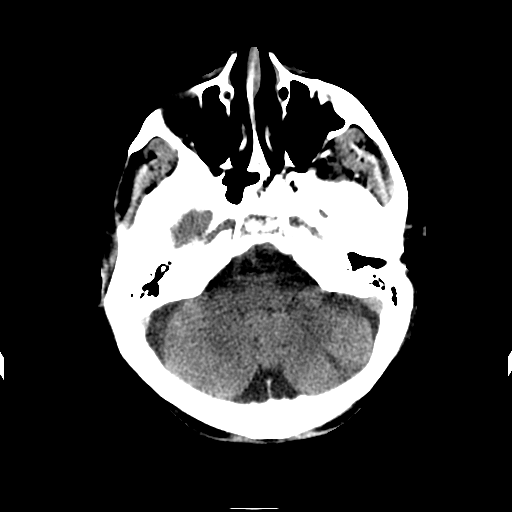
[im 11/31  brain]
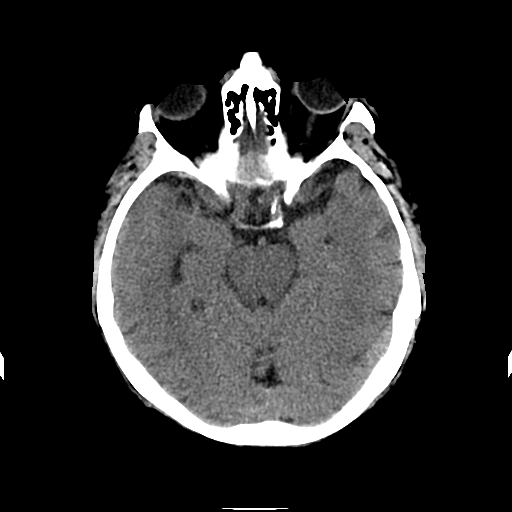
[im 13/31  brain]
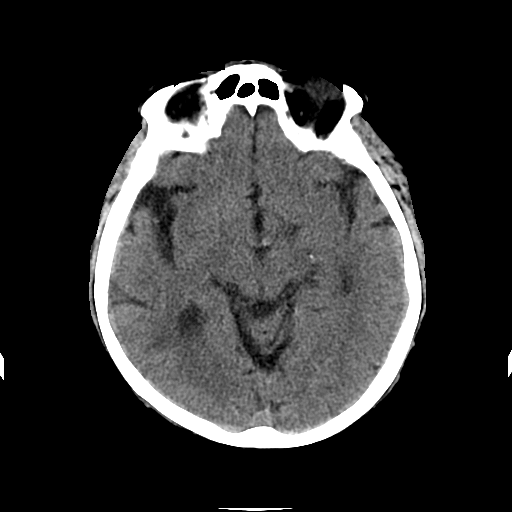
[im 13/31  bone]
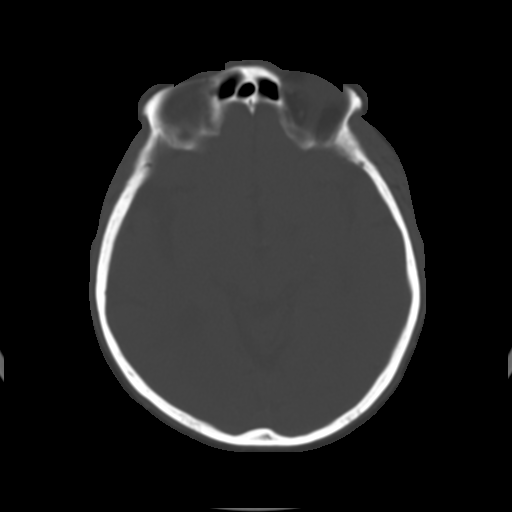
[im 16/31  brain]
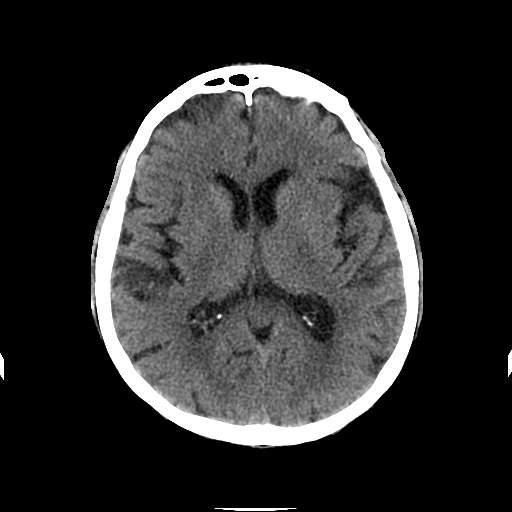
[im 18/31  brain]
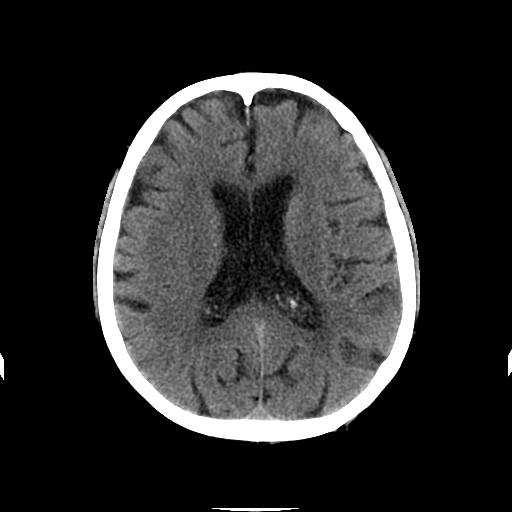
[im 20/31  brain]
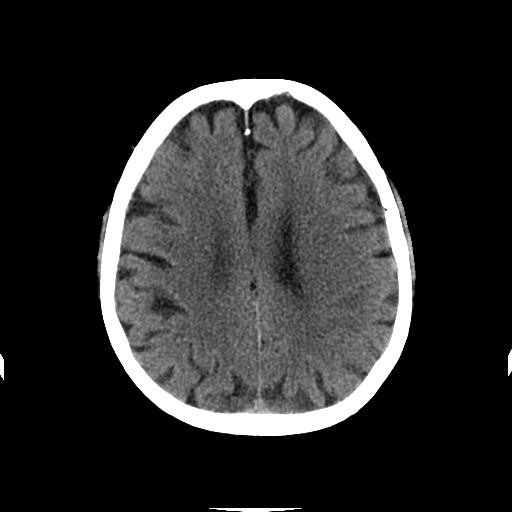
[im 24/31  brain]
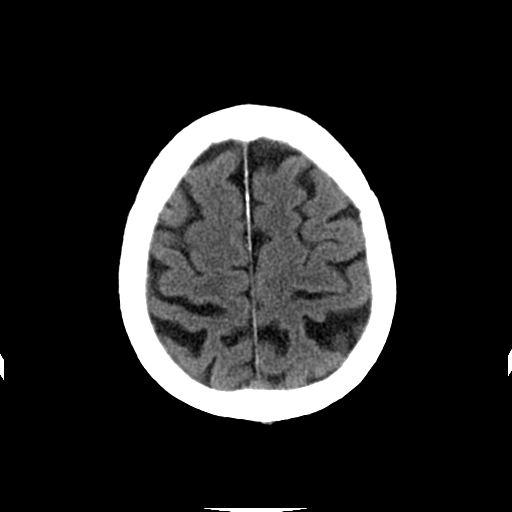
[im 24/31  bone]
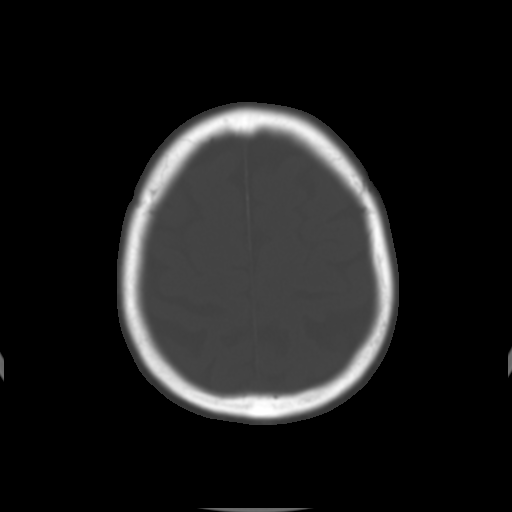
[im 26/31  brain]
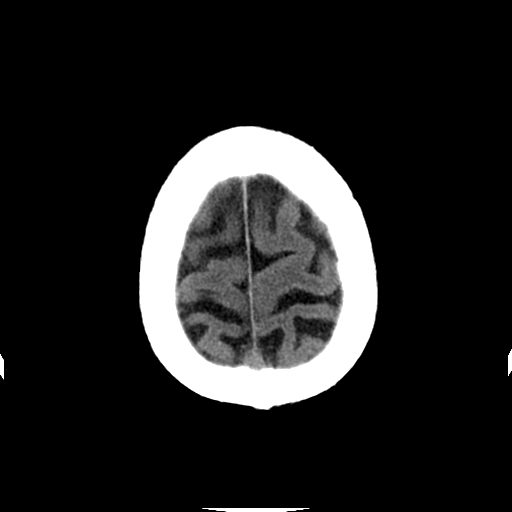
[im 28/31  brain]
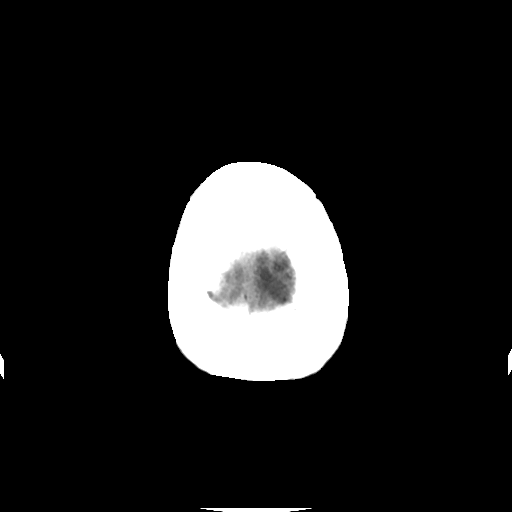

[Series 203: coronal st, idose (1) · coronal · 0.40mm/px · 3 of 75 slices shown]
[im 25/75  brain]
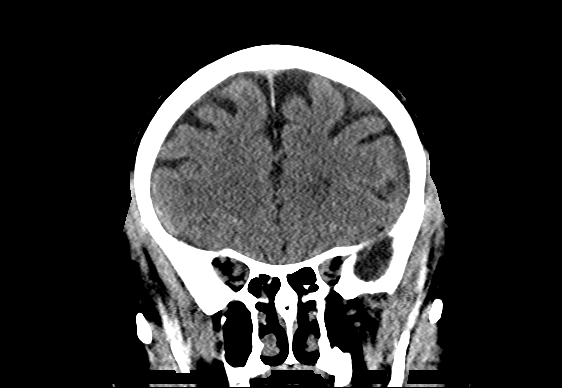
[im 33/75  brain]
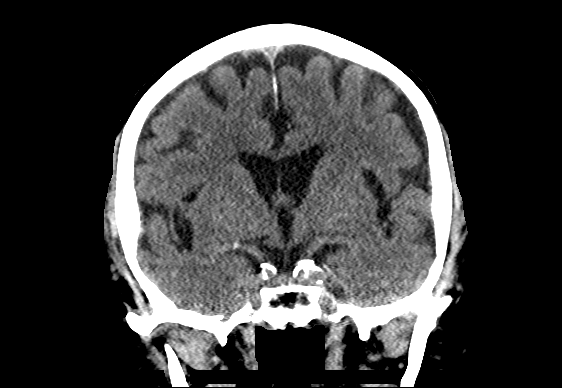
[im 42/75  brain]
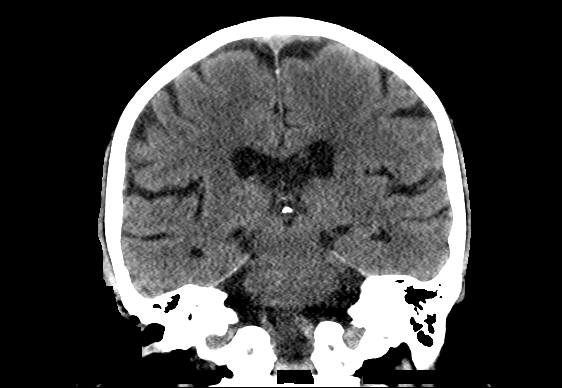

[Series 204: sagittal st, idose (1) · sagittal · 0.40mm/px · 3 of 75 slices shown]
[im 25/75  brain]
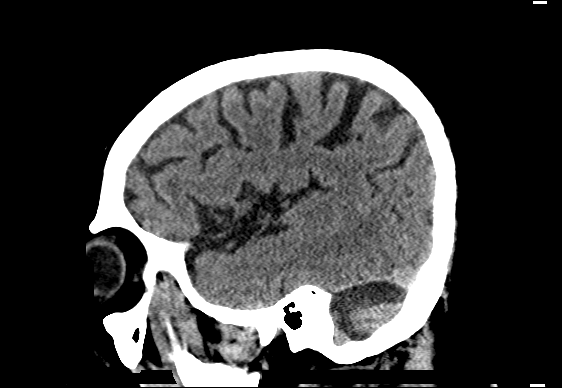
[im 38/75  brain]
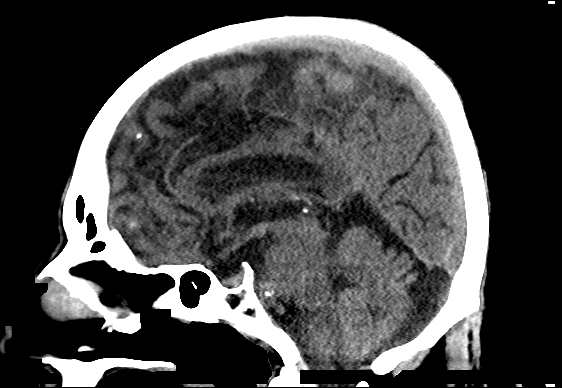
[im 50/75  brain]
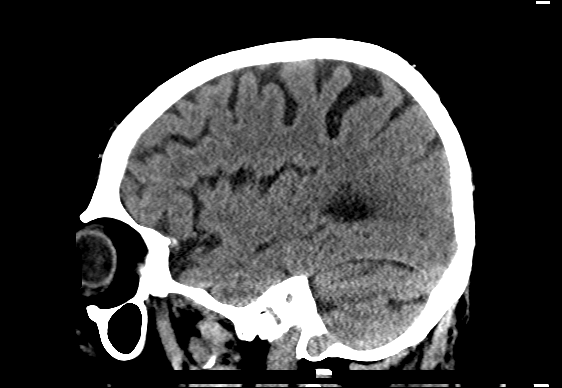

[17 of 47 positions shown; findings below may reference images not displayed]

FINDINGS: Brain: No evidence of acute infarction, hemorrhage, hydrocephalus,
extra-axial collection or mass lesion/mass effect.

Prominence of the ventricles and sulci reflects mild to moderate
cortical volume loss. Cerebellar atrophy is noted. Mild
periventricular white matter change likely reflects small vessel
ischemic microangiopathy.

The brainstem and fourth ventricle are within normal limits. The
basal ganglia are unremarkable in appearance. The cerebral
hemispheres demonstrate grossly normal gray-white differentiation.
No mass effect or midline shift is seen.

Vascular: No hyperdense vessel or unexpected calcification.

Skull: There is no evidence of fracture; visualized osseous
structures are unremarkable in appearance.

Sinuses/Orbits: The orbits are within normal limits. The paranasal
sinuses and mastoid air cells are well-aerated.

Other: No significant soft tissue abnormalities are seen.
IMPRESSION: 1. No acute intracranial pathology seen on CT.
2. Mild to moderate cortical volume loss and scattered small vessel
ischemic microangiopathy.

## 2017-06-13 IMAGING — DX DG CERVICAL SPINE 2 OR 3 VIEWS
2 series · 2 of 2 positions shown · non-contrast
Comparison: None.

CLINICAL DATA: Chronic neck pain, worse during the past 2 days.

EXAM:
CERVICAL SPINE - 2-3 VIEW

[c-spine lat]
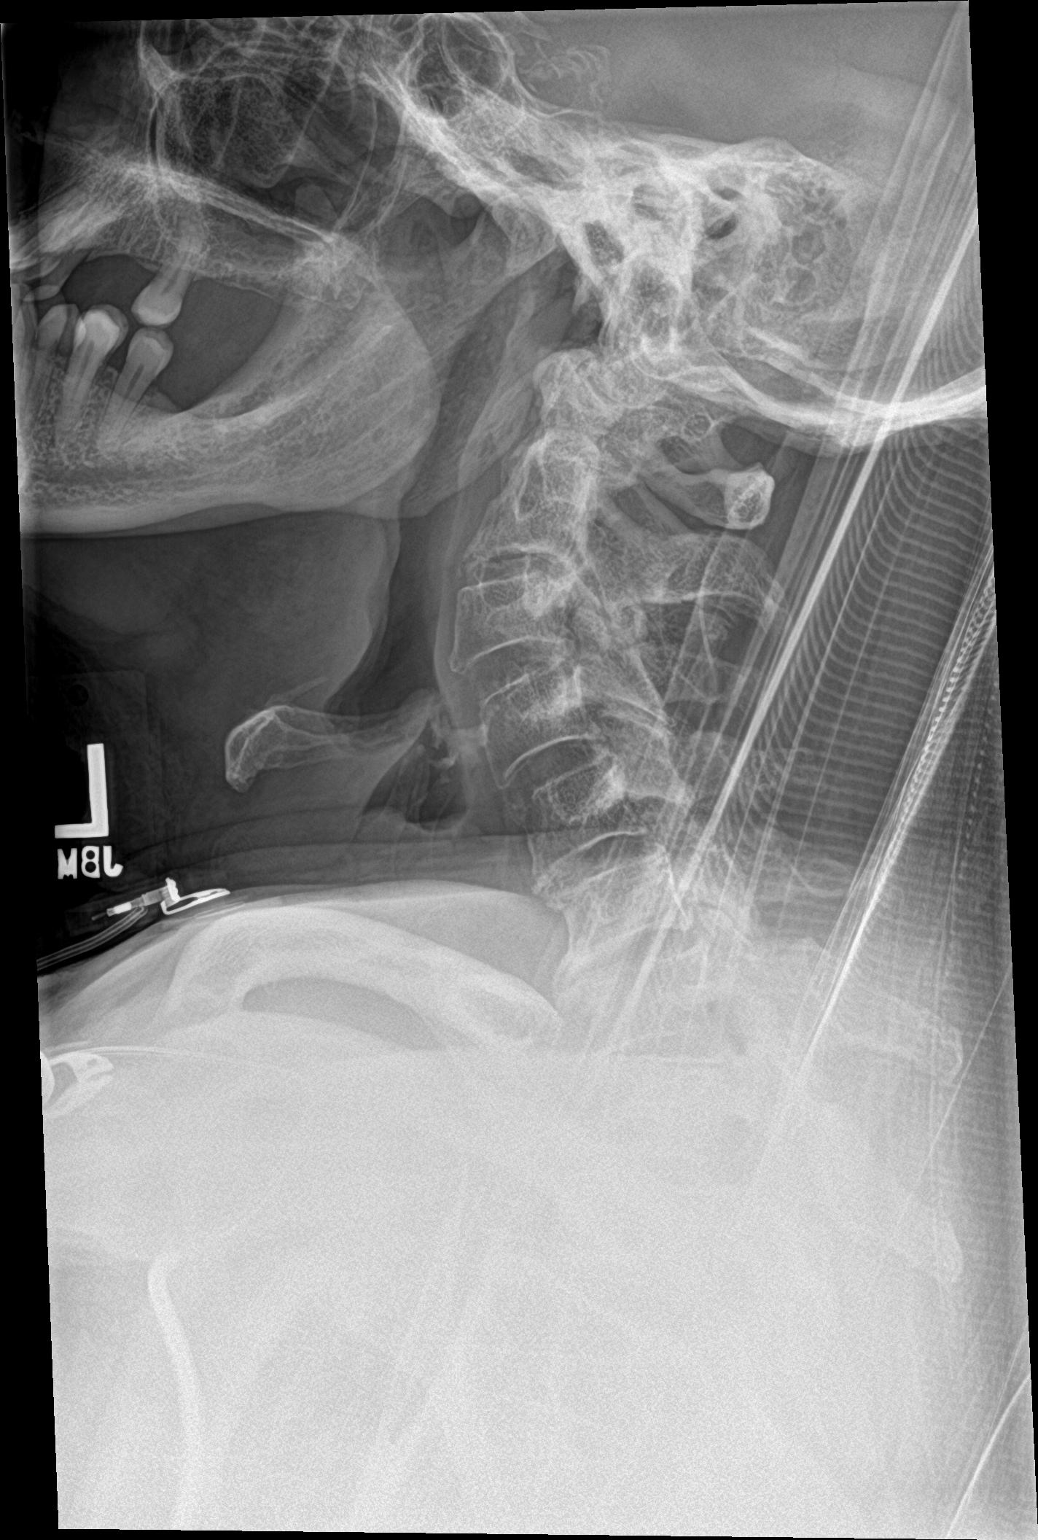

[c-spine ap]
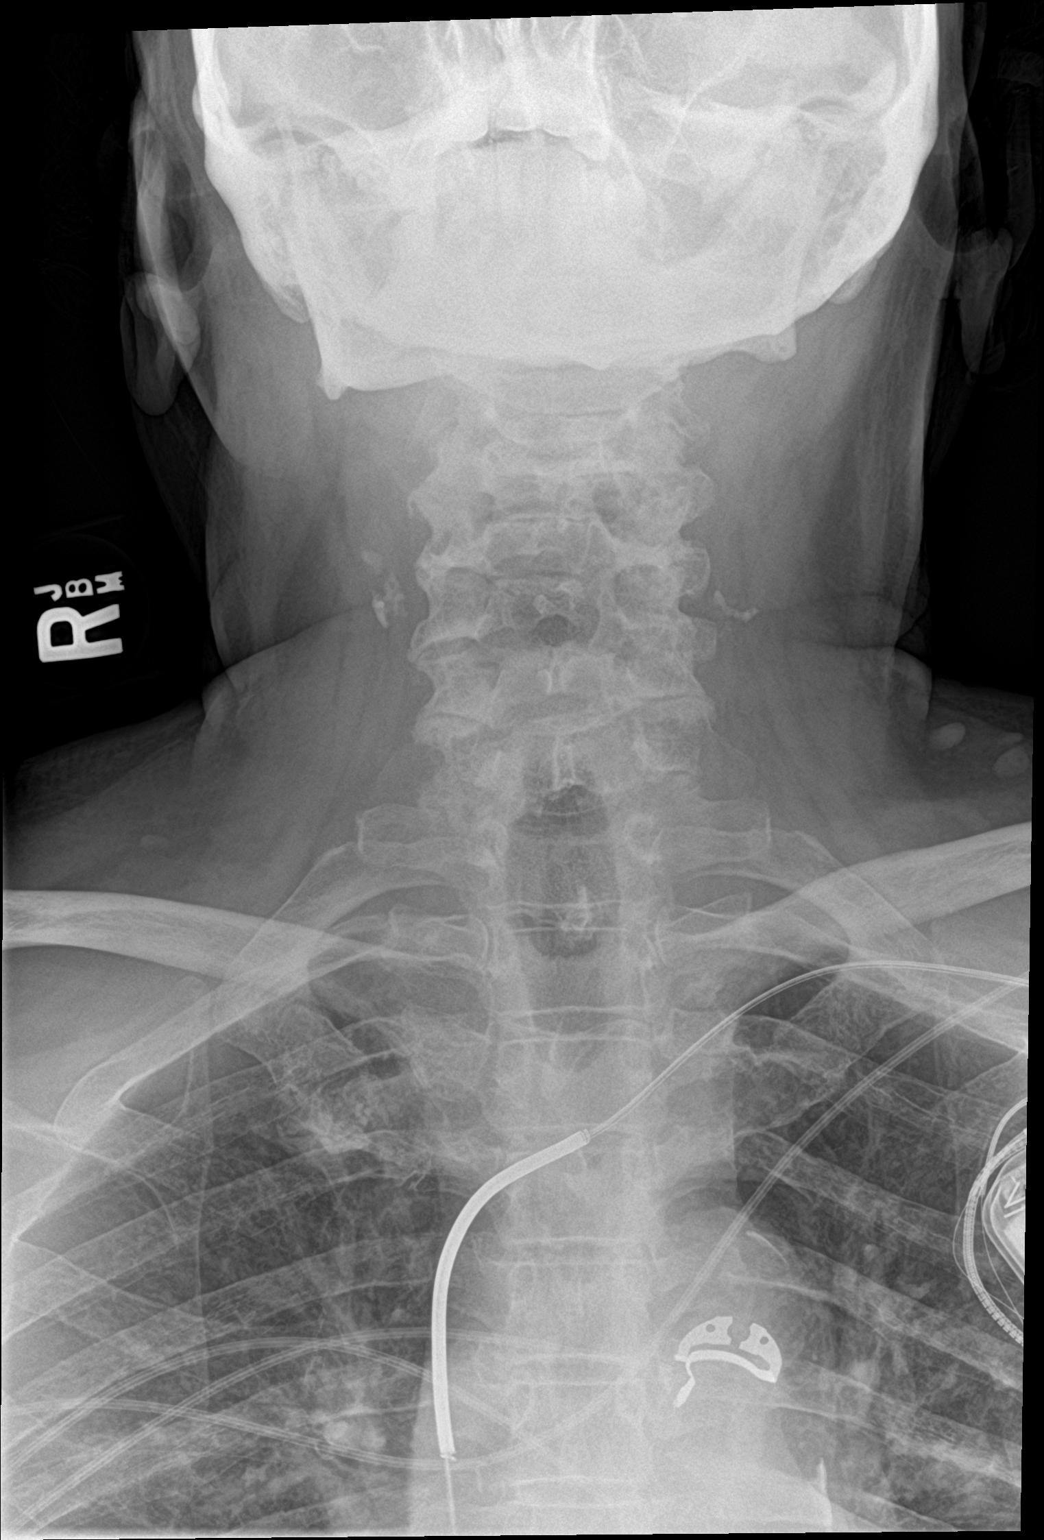

[2 of 2 positions shown; findings below may reference images not displayed]

FINDINGS: C1 to the superior endplate of T1 is imaged on the provided lateral
radiograph, though evaluation of the caudal aspect of the cervical
spine as well as the cervical thoracic junction is degraded
secondary overlying soft tissues and support apparatus.

There is apparent asymmetric widening involving the posterior aspect
of the C7-T1 intervertebral disc space. No anterolisthesis or
retrolisthesis. An open mouth odontoid radiograph was not provided.

Cervical vertebral body heights appear preserved. Prevertebral soft
tissues appear normal.

Mild multilevel cervical spine DDD, worse at C5-C6 and C6-C7 with
disc space height loss, endplate irregularity and sclerosis.

Atherosclerotic plaque overlies expected location the bilateral
carotid bulbs. Regional soft tissues appear normal.

Limited visualization of lung apices demonstrates a mid aspect of a
left anterior chest wall AICD/pacemaker. Atherosclerotic plaque
within the aortic arch.
IMPRESSION: 1. Apparent asymmetric widening involving the posterior aspect of
the C7-T1 intervertebral disc space, suboptimally evaluated due to
overlying osseous soft tissue structures and support apparatus.
Correlation for recent injury and tenderness at this location is
advised. If clinical concern persists, further evaluation with
cervical spine CT could be performed as indicated.
2. Mild multilevel cervical spine DDD.
3. Atherosclerotic plaque overlies expected location the bilateral
carotid bulbs. Further evaluation with dedicated nonemergent carotid
Doppler ultrasound could be performed as clinically indicated.
4. Aortic Atherosclerosis ([id number]-170.0)

## 2020-03-23 DEATH — deceased
